# Patient Record
Sex: Male | Born: 1980 | Race: White | Hispanic: No | Marital: Single | Smoking: Never smoker
Health system: Southern US, Community
[De-identification: ages and names within clinical notes are randomized; demographics above are authoritative.]

## PROBLEM LIST (undated history)

## (undated) DIAGNOSIS — K859 Acute pancreatitis without necrosis or infection, unspecified: Secondary | ICD-10-CM

## (undated) HISTORY — PX: CHOLECYSTECTOMY: SHX55

---

## 2010-12-13 DIAGNOSIS — A6000 Herpesviral infection of urogenital system, unspecified: Secondary | ICD-10-CM | POA: Insufficient documentation

## 2020-05-23 DIAGNOSIS — Z8719 Personal history of other diseases of the digestive system: Secondary | ICD-10-CM | POA: Insufficient documentation

## 2020-12-15 ENCOUNTER — Encounter (HOSPITAL_BASED_OUTPATIENT_CLINIC_OR_DEPARTMENT_OTHER): Payer: Self-pay | Admitting: *Deleted

## 2020-12-15 ENCOUNTER — Emergency Department (HOSPITAL_BASED_OUTPATIENT_CLINIC_OR_DEPARTMENT_OTHER): Payer: 59

## 2020-12-15 ENCOUNTER — Inpatient Hospital Stay (HOSPITAL_BASED_OUTPATIENT_CLINIC_OR_DEPARTMENT_OTHER)
Admission: EM | Admit: 2020-12-15 | Discharge: 2020-12-18 | DRG: 439 | Discharge status: Against Medical Advice | Payer: 59 | Attending: Family Medicine | Admitting: Family Medicine

## 2020-12-15 ENCOUNTER — Inpatient Hospital Stay (HOSPITAL_COMMUNITY): Payer: 59

## 2020-12-15 ENCOUNTER — Other Ambulatory Visit: Payer: Self-pay

## 2020-12-15 DIAGNOSIS — I10 Essential (primary) hypertension: Secondary | ICD-10-CM | POA: Diagnosis present

## 2020-12-15 DIAGNOSIS — R748 Abnormal levels of other serum enzymes: Secondary | ICD-10-CM | POA: Diagnosis present

## 2020-12-15 DIAGNOSIS — Z9049 Acquired absence of other specified parts of digestive tract: Secondary | ICD-10-CM

## 2020-12-15 DIAGNOSIS — K859 Acute pancreatitis without necrosis or infection, unspecified: Secondary | ICD-10-CM

## 2020-12-15 DIAGNOSIS — D649 Anemia, unspecified: Secondary | ICD-10-CM | POA: Diagnosis not present

## 2020-12-15 DIAGNOSIS — K8521 Alcohol induced acute pancreatitis with uninfected necrosis: Principal | ICD-10-CM | POA: Diagnosis present

## 2020-12-15 DIAGNOSIS — R17 Unspecified jaundice: Secondary | ICD-10-CM | POA: Diagnosis present

## 2020-12-15 DIAGNOSIS — R7989 Other specified abnormal findings of blood chemistry: Secondary | ICD-10-CM | POA: Diagnosis present

## 2020-12-15 DIAGNOSIS — R000 Tachycardia, unspecified: Secondary | ICD-10-CM | POA: Diagnosis present

## 2020-12-15 DIAGNOSIS — K8689 Other specified diseases of pancreas: Secondary | ICD-10-CM | POA: Diagnosis not present

## 2020-12-15 DIAGNOSIS — I82890 Acute embolism and thrombosis of other specified veins: Secondary | ICD-10-CM | POA: Diagnosis not present

## 2020-12-15 DIAGNOSIS — Z5329 Procedure and treatment not carried out because of patient's decision for other reasons: Secondary | ICD-10-CM | POA: Diagnosis present

## 2020-12-15 DIAGNOSIS — R7401 Elevation of levels of liver transaminase levels: Secondary | ICD-10-CM | POA: Diagnosis present

## 2020-12-15 DIAGNOSIS — K8522 Alcohol induced acute pancreatitis with infected necrosis: Secondary | ICD-10-CM | POA: Diagnosis not present

## 2020-12-15 DIAGNOSIS — F1020 Alcohol dependence, uncomplicated: Secondary | ICD-10-CM | POA: Diagnosis present

## 2020-12-15 DIAGNOSIS — K852 Alcohol induced acute pancreatitis without necrosis or infection: Secondary | ICD-10-CM | POA: Diagnosis not present

## 2020-12-15 DIAGNOSIS — I864 Gastric varices: Secondary | ICD-10-CM | POA: Diagnosis present

## 2020-12-15 DIAGNOSIS — Z20822 Contact with and (suspected) exposure to covid-19: Secondary | ICD-10-CM | POA: Diagnosis present

## 2020-12-15 DIAGNOSIS — R509 Fever, unspecified: Secondary | ICD-10-CM | POA: Diagnosis not present

## 2020-12-15 DIAGNOSIS — I82891 Chronic embolism and thrombosis of other specified veins: Secondary | ICD-10-CM | POA: Diagnosis present

## 2020-12-15 DIAGNOSIS — E876 Hypokalemia: Secondary | ICD-10-CM | POA: Diagnosis not present

## 2020-12-15 HISTORY — DX: Acute pancreatitis without necrosis or infection, unspecified: K85.90

## 2020-12-15 LAB — CBC
HCT: 42.5 % (ref 39.0–52.0)
HCT: 43.5 % (ref 39.0–52.0)
Hemoglobin: 15.8 g/dL (ref 13.0–17.0)
Hemoglobin: 15.3 g/dL (ref 13.0–17.0)
MCH: 33.7 pg (ref 26.0–34.0)
MCH: 33.3 pg (ref 26.0–34.0)
MCHC: 37.2 g/dL — ABNORMAL HIGH (ref 30.0–36.0)
MCHC: 35.2 g/dL (ref 30.0–36.0)
MCV: 90.6 fL (ref 80.0–100.0)
MCV: 94.8 fL (ref 80.0–100.0)
Platelets: 304 10*3/uL (ref 150–400)
Platelets: 248 10*3/uL (ref 150–400)
RBC: 4.69 MIL/uL (ref 4.22–5.81)
RBC: 4.59 MIL/uL (ref 4.22–5.81)
RDW: 13.4 % (ref 11.5–15.5)
RDW: 13.9 % (ref 11.5–15.5)
WBC: 17.1 10*3/uL — ABNORMAL HIGH (ref 4.0–10.5)
WBC: 14.4 10*3/uL — ABNORMAL HIGH (ref 4.0–10.5)
nRBC: 0 % (ref 0.0–0.2)
nRBC: 0 % (ref 0.0–0.2)

## 2020-12-15 LAB — MAGNESIUM
Magnesium: 1.7 mg/dL (ref 1.7–2.4)
Magnesium: 1.6 mg/dL — ABNORMAL LOW (ref 1.7–2.4)

## 2020-12-15 LAB — COMPREHENSIVE METABOLIC PANEL
ALT: 24 U/L (ref 0–44)
ALT: 167 U/L — ABNORMAL HIGH (ref 0–44)
AST: 25 U/L (ref 15–41)
AST: 441 U/L — ABNORMAL HIGH (ref 15–41)
Albumin: 4.1 g/dL (ref 3.5–5.0)
Albumin: 3.9 g/dL (ref 3.5–5.0)
Alkaline Phosphatase: 68 U/L (ref 38–126)
Alkaline Phosphatase: 87 U/L (ref 38–126)
Anion gap: 13 (ref 5–15)
Anion gap: 9 (ref 5–15)
BUN: 13 mg/dL (ref 6–20)
BUN: 10 mg/dL (ref 6–20)
CO2: 21 mmol/L — ABNORMAL LOW (ref 22–32)
CO2: 21 mmol/L — ABNORMAL LOW (ref 22–32)
Calcium: 8.6 mg/dL — ABNORMAL LOW (ref 8.9–10.3)
Calcium: 8.4 mg/dL — ABNORMAL LOW (ref 8.9–10.3)
Chloride: 104 mmol/L (ref 98–111)
Chloride: 111 mmol/L (ref 98–111)
Creatinine, Ser: 0.94 mg/dL (ref 0.61–1.24)
Creatinine, Ser: 0.94 mg/dL (ref 0.61–1.24)
GFR, Estimated: >60 mL/min (ref >60)
GFR, Estimated: >60 mL/min (ref >60)
Glucose, Bld: 132 mg/dL — ABNORMAL HIGH (ref 70–99)
Glucose, Bld: 123 mg/dL — ABNORMAL HIGH (ref 70–99)
Potassium: 3.4 mmol/L — ABNORMAL LOW (ref 3.5–5.1)
Potassium: 3.5 mmol/L (ref 3.5–5.1)
Sodium: 138 mmol/L (ref 135–145)
Sodium: 141 mmol/L (ref 135–145)
Total Bilirubin: 0.9 mg/dL (ref 0.3–1.2)
Total Bilirubin: 2.6 mg/dL — ABNORMAL HIGH (ref 0.3–1.2)
Total Protein: 7.1 g/dL (ref 6.5–8.1)
Total Protein: 6.5 g/dL (ref 6.5–8.1)

## 2020-12-15 LAB — PHOSPHORUS
Phosphorus: 3.9 mg/dL (ref 2.5–4.6)
Phosphorus: 3.7 mg/dL (ref 2.5–4.6)

## 2020-12-15 LAB — PROTIME-INR
INR: 1 (ref 0.8–1.2)
Prothrombin Time: 13.3 seconds (ref 11.4–15.2)

## 2020-12-15 LAB — APTT: aPTT: 25 seconds (ref 24–36)

## 2020-12-15 LAB — GAMMA GT: GGT: 342 U/L — ABNORMAL HIGH (ref 7–50)

## 2020-12-15 LAB — LIPASE, BLOOD: Lipase: 686 U/L — ABNORMAL HIGH (ref 11–51)

## 2020-12-15 LAB — RESP PANEL BY RT-PCR (FLU A&B, COVID) ARPGX2
Influenza A by PCR: NEGATIVE
Influenza B by PCR: NEGATIVE
SARS Coronavirus 2 by RT PCR: NEGATIVE

## 2020-12-15 LAB — HIV ANTIBODY (ROUTINE TESTING W REFLEX): HIV Screen 4th Generation wRfx: NONREACTIVE

## 2020-12-15 MED ORDER — ONDANSETRON HCL 4 MG/2ML IJ SOLN
4.0000 mg | Freq: Once | INTRAMUSCULAR | Status: AC
  Administered 2020-12-15: 4 mg via INTRAVENOUS
  Filled 2020-12-15: qty 2

## 2020-12-15 MED ORDER — ONDANSETRON HCL 4 MG PO TABS: 4.0000 mg | ORAL_TABLET | Freq: Four times a day (QID) | ORAL | Status: DC | PRN

## 2020-12-15 MED ORDER — LORAZEPAM 1 MG PO TABS
0.0000 mg | ORAL_TABLET | Freq: Four times a day (QID) | ORAL | Status: AC
  Administered 2020-12-15 – 2020-12-16 (×5): 1 mg via ORAL
  Filled 2020-12-15 (×3): qty 1

## 2020-12-15 MED ORDER — IOHEXOL 300 MG/ML  SOLN
100.0000 mL | Freq: Once | INTRAMUSCULAR | Status: AC | PRN
  Administered 2020-12-15: 100 mL via INTRAVENOUS

## 2020-12-15 MED ORDER — LORAZEPAM 1 MG PO TABS: 0.0000 mg | ORAL_TABLET | Freq: Two times a day (BID) | ORAL | Status: DC

## 2020-12-15 MED ORDER — SODIUM CHLORIDE 0.9 % IV SOLN: INTRAVENOUS | Status: DC

## 2020-12-15 MED ORDER — LORAZEPAM 2 MG/ML IJ SOLN: 1.0000 mg | INTRAMUSCULAR | Status: DC | PRN

## 2020-12-15 MED ORDER — ACETAMINOPHEN 650 MG RE SUPP: 650.0000 mg | Freq: Four times a day (QID) | RECTAL | Status: DC | PRN

## 2020-12-15 MED ORDER — ADULT MULTIVITAMIN W/MINERALS CH
1.0000 | ORAL_TABLET | Freq: Every day | ORAL | Status: DC
  Administered 2020-12-15: 1 via ORAL
  Filled 2020-12-15: qty 1

## 2020-12-15 MED ORDER — LACTATED RINGERS IV SOLN: INTRAVENOUS | Status: DC

## 2020-12-15 MED ORDER — ONDANSETRON HCL 4 MG/2ML IJ SOLN
INTRAMUSCULAR | Status: AC
  Filled 2020-12-15: qty 2

## 2020-12-15 MED ORDER — GADOBUTROL 1 MMOL/ML IV SOLN
8.0000 mL | Freq: Once | INTRAVENOUS | Status: AC | PRN
  Administered 2020-12-15: 8 mL via INTRAVENOUS

## 2020-12-15 MED ORDER — MAGNESIUM SULFATE 2 GM/50ML IV SOLN
2.0000 g | Freq: Once | INTRAVENOUS | Status: AC
  Administered 2020-12-15: 2 g via INTRAVENOUS
  Filled 2020-12-15: qty 50

## 2020-12-15 MED ORDER — THIAMINE HCL 100 MG/ML IJ SOLN: 100.0000 mg | Freq: Every day | INTRAMUSCULAR | Status: DC

## 2020-12-15 MED ORDER — LORAZEPAM 1 MG PO TABS
1.0000 mg | ORAL_TABLET | ORAL | Status: DC | PRN
  Filled 2020-12-15 (×2): qty 1

## 2020-12-15 MED ORDER — SODIUM CHLORIDE 0.9% FLUSH
3.0000 mL | Freq: Two times a day (BID) | INTRAVENOUS | Status: DC
  Administered 2020-12-15 – 2020-12-17 (×4): 3 mL via INTRAVENOUS

## 2020-12-15 MED ORDER — FENTANYL CITRATE (PF) 100 MCG/2ML IJ SOLN
50.0000 ug | Freq: Once | INTRAMUSCULAR | Status: AC
  Administered 2020-12-15: 50 ug via INTRAVENOUS
  Filled 2020-12-15: qty 2

## 2020-12-15 MED ORDER — ONDANSETRON HCL 4 MG/2ML IJ SOLN
4.0000 mg | Freq: Four times a day (QID) | INTRAMUSCULAR | Status: DC | PRN
  Administered 2020-12-16: 4 mg via INTRAVENOUS
  Filled 2020-12-15: qty 2

## 2020-12-15 MED ORDER — POLYETHYLENE GLYCOL 3350 17 G PO PACK: 17.0000 g | PACK | Freq: Every day | ORAL | Status: DC | PRN

## 2020-12-15 MED ORDER — SODIUM CHLORIDE 0.9 % IV BOLUS
500.0000 mL | Freq: Once | INTRAVENOUS | Status: AC
  Administered 2020-12-15: 500 mL via INTRAVENOUS

## 2020-12-15 MED ORDER — HALOPERIDOL LACTATE 5 MG/ML IJ SOLN
2.0000 mg | Freq: Once | INTRAMUSCULAR | Status: AC
  Administered 2020-12-15: 2 mg via INTRAVENOUS
  Filled 2020-12-15: qty 1

## 2020-12-15 MED ORDER — ACETAMINOPHEN 325 MG PO TABS
650.0000 mg | ORAL_TABLET | Freq: Four times a day (QID) | ORAL | Status: DC | PRN
  Administered 2020-12-16 – 2020-12-18 (×4): 650 mg via ORAL
  Filled 2020-12-15 (×4): qty 2

## 2020-12-15 MED ORDER — HYDROMORPHONE HCL 1 MG/ML IJ SOLN
0.5000 mg | INTRAMUSCULAR | Status: DC | PRN
  Administered 2020-12-15 – 2020-12-17 (×15): 1 mg via INTRAVENOUS
  Filled 2020-12-15 (×15): qty 1

## 2020-12-15 MED ORDER — THIAMINE HCL 100 MG PO TABS
100.0000 mg | ORAL_TABLET | Freq: Every day | ORAL | Status: DC
  Administered 2020-12-15 – 2020-12-17 (×3): 100 mg via ORAL
  Filled 2020-12-15 (×3): qty 1

## 2020-12-15 MED ORDER — FOLIC ACID 1 MG PO TABS
1.0000 mg | ORAL_TABLET | Freq: Every day | ORAL | Status: DC
  Administered 2020-12-15 – 2020-12-17 (×3): 1 mg via ORAL
  Filled 2020-12-15 (×3): qty 1

## 2020-12-15 MED ORDER — MORPHINE SULFATE (PF) 4 MG/ML IV SOLN
4.0000 mg | Freq: Once | INTRAVENOUS | Status: AC
  Administered 2020-12-15: 4 mg via INTRAVENOUS
  Filled 2020-12-15: qty 1

## 2020-12-15 MED ORDER — ENOXAPARIN SODIUM 40 MG/0.4ML IJ SOSY
40.0000 mg | PREFILLED_SYRINGE | INTRAMUSCULAR | Status: DC
  Administered 2020-12-15 – 2020-12-17 (×3): 40 mg via SUBCUTANEOUS
  Filled 2020-12-15 (×3): qty 0.4

## 2020-12-15 MED ORDER — MORPHINE SULFATE (PF) 2 MG/ML IV SOLN
2.0000 mg | Freq: Once | INTRAVENOUS | Status: AC
  Administered 2020-12-15: 2 mg via INTRAVENOUS
  Filled 2020-12-15: qty 1

## 2020-12-15 MED ORDER — MORPHINE SULFATE (PF) 4 MG/ML IV SOLN
8.0000 mg | Freq: Once | INTRAVENOUS | Status: AC
  Administered 2020-12-15: 8 mg via INTRAVENOUS
  Filled 2020-12-15: qty 2

## 2020-12-15 MED ORDER — HYDRALAZINE HCL 20 MG/ML IJ SOLN
5.0000 mg | INTRAMUSCULAR | Status: AC | PRN
Start: 2020-12-15 — End: 2020-12-16
  Administered 2020-12-15 – 2020-12-16 (×3): 5 mg via INTRAVENOUS
  Filled 2020-12-15 (×3): qty 1

## 2020-12-15 MED ORDER — OXYCODONE HCL 5 MG PO TABS
5.0000 mg | ORAL_TABLET | ORAL | Status: DC | PRN
  Administered 2020-12-15: 5 mg via ORAL
  Filled 2020-12-15: qty 1

## 2020-12-15 MED ORDER — METOPROLOL TARTRATE 5 MG/5ML IV SOLN
5.0000 mg | Freq: Four times a day (QID) | INTRAVENOUS | Status: DC | PRN
  Administered 2020-12-15 – 2020-12-17 (×8): 5 mg via INTRAVENOUS
  Filled 2020-12-15 (×9): qty 5

## 2020-12-15 NOTE — Plan of Care (Signed)
  Problem: Education: Goal: Knowledge of General Education information will improve Description Including pain rating scale, medication(s)/side effects and non-pharmacologic comfort measures Outcome: Progressing   

## 2020-12-15 NOTE — ED Provider Notes (Signed)
[facility] EMERGENCY DEPARTMENT Provider Note   CSN: [id number] Arrival date & time: 12/15/20  0004     History Chief Complaint  Patient presents with  . Pancreatitis    Brian Moran is a 40 y.o. male.  The history is provided by the patient.  Abdominal Pain Pain location:  RUQ and epigastric Pain quality: cramping   Pain radiates to:  R flank Pain severity:  Severe Onset quality:  Gradual Duration:  1 day Timing:  Constant Progression:  Worsening Chronicity:  Recurrent Context: alcohol use and eating   Relieved by:  Nothing Worsened by:  Nothing Ineffective treatments:  None tried Associated symptoms: no cough, no diarrhea and no fever   Risk factors: not elderly   Patient with a h/o pancreatitis visiting from [place] presents with typical pancreatitis pain.  No f/c/r.  Has been eating barbecue and drinking heavily.      Past Medical History:  Diagnosis Date  . Pancreatitis     There are no problems to display for this patient.   Past Surgical History:  Procedure Laterality Date  . CHOLECYSTECTOMY         History reviewed. No pertinent family history.  Social History   Tobacco Use  . Smoking status: Never  . Smokeless tobacco: Never  Substance Use Topics  . Alcohol use: Yes  . Drug use: Never    Home Medications Prior to Admission medications   Not on File    Allergies    Patient has no known allergies.  Review of Systems   Review of Systems  Constitutional:  Negative for fever.  HENT:  Negative for facial swelling.   Eyes:  Negative for redness.  Respiratory:  Negative for cough.   Gastrointestinal:  Positive for abdominal pain. Negative for diarrhea.  Genitourinary:  Negative for difficulty urinating.  Musculoskeletal:  Negative for neck stiffness.  Skin:  Negative for rash.  Neurological:  Negative for facial asymmetry.  Psychiatric/Behavioral:  Negative for agitation.   All other systems reviewed and are  negative.  Physical Exam Updated Vital Signs BP (!) 169/103   Pulse 72   Temp 98.9 F (37.2 C) (Oral)   Resp 20   Wt 79.4 kg   SpO2 97%   Physical Exam Vitals and nursing note reviewed.  Constitutional:      Appearance: Normal appearance. He is not diaphoretic.  HENT:     Head: Normocephalic and atraumatic.     Nose: Nose normal.  Eyes:     Conjunctiva/sclera: Conjunctivae normal.     Pupils: Pupils are equal, round, and reactive to light.  Cardiovascular:     Rate and Rhythm: Normal rate and regular rhythm.     Pulses: Normal pulses.     Heart sounds: Normal heart sounds.  Pulmonary:     Effort: Pulmonary effort is normal.     Breath sounds: Normal breath sounds.  Abdominal:     General: Bowel sounds are normal. There is no distension.     Palpations: Abdomen is soft.     Tenderness: There is no guarding or rebound.  Musculoskeletal:        General: Normal range of motion.     Cervical back: Normal range of motion and neck supple.  Skin:    General: Skin is warm and dry.     Capillary Refill: Capillary refill takes less than 2 seconds.  Neurological:     General: No focal deficit present.     Mental Status:  He is alert and oriented to person, place, and time.  Psychiatric:        Mood and Affect: Mood normal.        Behavior: Behavior normal.    ED Results / Procedures / Treatments   Labs (all labs ordered are listed, but only abnormal results are displayed) Results for orders placed or performed during the hospital encounter of 12/15/20  Lipase, blood  Result Value Ref Range   Lipase 686 (H) 11 - 51 U/L  Comprehensive metabolic panel  Result Value Ref Range   Sodium 138 135 - 145 mmol/L   Potassium 3.4 (L) 3.5 - 5.1 mmol/L   Chloride 104 98 - 111 mmol/L   CO2 21 (L) 22 - 32 mmol/L   Glucose, Bld 132 (H) 70 - 99 mg/dL   BUN 13 6 - 20 mg/dL   Creatinine, Ser 2.42 0.61 - 1.24 mg/dL   Calcium 8.6 (L) 8.9 - 10.3 mg/dL   Total Protein 7.1 6.5 - 8.1 g/dL    Albumin 4.1 3.5 - 5.0 g/dL   AST 25 15 - 41 U/L   ALT 24 0 - 44 U/L   Alkaline Phosphatase 68 38 - 126 U/L   Total Bilirubin 0.9 0.3 - 1.2 mg/dL   GFR, Estimated >68 >34 mL/min   Anion gap 13 5 - 15  CBC  Result Value Ref Range   WBC 17.1 (H) 4.0 - 10.5 K/uL   RBC 4.69 4.22 - 5.81 MIL/uL   Hemoglobin 15.8 13.0 - 17.0 g/dL   HCT 19.6 22.2 - 97.9 %   MCV 90.6 80.0 - 100.0 fL   MCH 33.7 26.0 - 34.0 pg   MCHC 37.2 (H) 30.0 - 36.0 g/dL   RDW 89.2 11.9 - 41.7 %   Platelets 304 150 - 400 K/uL   nRBC 0.0 0.0 - 0.2 %   No results found.  EKG EKG Interpretation  Date/Time:  Saturday December 15 2020 00:54:55 EDT Ventricular Rate:  71 PR Interval:  134 QRS Duration: 95 QT Interval:  398 QTC Calculation: 433 R Axis:   74 Text Interpretation: Sinus rhythm Confirmed by Nicanor Alcon, Tila Millirons ([id number]) on 12/15/2020 1:26:56 AM  Radiology No results found.  Procedures Procedures   Medications Ordered in ED Medications  ondansetron (ZOFRAN) injection 4 mg (has no administration in time range)  fentaNYL (SUBLIMAZE) injection 50 mcg (has no administration in time range)  sodium chloride 0.9 % bolus 500 mL (500 mLs Intravenous New Bag/Given 12/15/20 0223)    ED Course  I have reviewed the triage vital signs and the nursing notes.  Pertinent labs & imaging results that were available during my care of the patient were reviewed by me and considered in my medical decision making (see chart for details).    Brian Moran was evaluated in Emergency Department on 12/15/2020 for the symptoms described in the history of present illness. He was evaluated in the context of the global COVID-19 pandemic, which necessitated consideration that the patient might be at risk for infection with the SARS-CoV-2 virus that causes COVID-19. Institutional protocols and algorithms that pertain to the evaluation of patients at risk for COVID-19 are in a state of rapid change based on information released by regulatory  bodies including the CDC and federal and state organizations. These policies and algorithms were followed during the patient's care in the ED.  Final Clinical Impression(s) / ED Diagnoses Final diagnoses:  Acute pancreatitis, unspecified complication status, unspecified pancreatitis type  Admit  to medicine for acute pancreatitis   Rx / DC Orders ED Discharge Orders     None        Sukaina Toothaker, MD 12/15/20 6378

## 2020-12-15 NOTE — ED Triage Notes (Signed)
Pt with hx of pancreatitis (last hospitalization was last year) is here for flare up.  Pt reports abdominal pain since this am.  Pt reports that he is on vacation so he was not adhering to his usual diet and reports that he was drinking heavily.  Pain 7/10, pt appears uncomfortable.

## 2020-12-15 NOTE — Progress Notes (Signed)
   12/15/20 2101 12/15/20 2116 12/15/20 2204  Assess: MEWS Score  Temp 99 F (37.2 C)  --   --   BP (!) 179/116  --  (!) 174/109  Pulse Rate (!) 110  --   --   Resp 18  --   --   SpO2 90 %  --   --   O2 Device Room Air  --   --   Assess: MEWS Score  MEWS Temp 0  --  0  MEWS Systolic 0  --  0  MEWS Pulse 1  --  1  MEWS RR 0  --  0  MEWS LOC 0  --  0  MEWS Score 1  --  1  MEWS Score Color [place]  --  [place]  Treat  Pain Score  --  3  --   Notify: Provider  Provider Name/Title  --  X. Blount NP  --   Date Provider Notified  --  12/15/20  --   Time Provider Notified  --  2130  --   Notification Type  --   (amion)  --   Notification Reason  --  Other (Comment) (BP elevated 179/116)  --   Provider response  --  See new orders  --   Date of Provider Response  --  12/15/20  --   Time of Provider Response  --  2139  --   Assess: SIRS CRITERIA  SIRS Temperature  0  --   --   SIRS Pulse 1  --   --   SIRS Respirations  0  --   --   SIRS WBC 0  --   --   SIRS Score Sum  1  --   --   Hydralazine ordered and given at 2205. Will follow up and recheck

## 2020-12-15 NOTE — H&P (Signed)
[facility] History and Physical  Brian Moran PIR:[id number] DOB: 08-06-1980 DOA: 12/15/2020  Referring physician: Dr. Nicanor Alcon PCP: No primary care provider on file.   Chief Complaint: abdominal pain  HPI: Brian Moran is a 40 y.o. male with history of alcohol use disorder, acute pancreatitis, presents with abdominal pain.  Patient is currently visiting his mother who moved here recently for her job.  He resides in [facility] [place].  Review of care everywhere shows patient was admitted in January 2022 for acute pancreatitis, per discharge summary did not appear to have any significant symptoms of alcohol withdrawal but was discharged with Librium.  Further review shows that patient had gallstone pancreatitis in November 2020, at that time he had an ERCP with biliary sphincterotomy and placement of a biliary stent.  It was subsequently removed in February 2021.  On my interview patient states that he was eating a lot of barbecue and drinking heavily yesterday when he began to have abdominal pain.  Abdominal pain continued to worsen so he presented for care.  Pain is located across the epigastrium and upper abdominal quadrants, and also radiates to his back.  Endorses nausea but no other symptoms, denies fever, cough, chest pain, shortness of breath, diarrhea.  He denies daily alcohol use but states that he drinks around 1-2 times a week.  He estimates he drinks about 3 beers on the days that he does drink.  He endorses feeling that his drinking is a problem and reports he has been prescribed naltrexone in the past.  He is interested in therapy to help with his drinking.  He also reports a history of gallbladder removal in the past that happened at the same time as prior pancreatitis episodes, though he also notes he was drinking during that episode as well.  Patient denies ever having had symptoms of alcohol withdrawal in the past.  Initially presented to med [facility]  ED.  In the ED initial vital signs notable only for significant hypertension with BPs ranging 140s to 180s over 80s to 110s.  Initial lab work-up notable for lipase of 686, CMP showing no other significant abnormalities, CBC with white count of 17 but otherwise normal.  EKG was unremarkable.  COVID test was negative.  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  A repeat CMP was obtained which showed a significant jump in LFTs with AST 441, ALT 167, and a T bili elevation to 2.6 which had not been seen previously.  He was treated with fluids, pain medications, and admitted for further management.  Review of Systems:  Pertinent positives and negative per HPI, all others reviewed and negative  Past Medical History:  Diagnosis Date   Pancreatitis    Past Surgical History:  Procedure Laterality Date   CHOLECYSTECTOMY     Social History:  reports that he has never smoked. He has never used smokeless tobacco. He reports current alcohol use. He reports that he does not use drugs.  No Known Allergies  History reviewed. No pertinent family history.   Prior to Admission medications   Medication Sig Start Date End Date Taking? Authorizing Provider  ibuprofen (ADVIL) 200 MG tablet Take 400 mg by mouth every 6 (six) hours as needed.   Yes [provider]   Physical Exam: Vitals:   12/15/20 0800 12/15/20 0808 12/15/20 0849 12/15/20 0852  BP: (!) 155/87   Marland Kitchen)  189/106  Pulse: 72   60  Resp:    (!) 22  Temp:  99 F (37.2 C)  98.5 F (36.9 C)  TempSrc:  Oral  Oral  SpO2: 98%   97%  Weight:   87.7 kg   Height:   5\' 7"  (1.702 m)     Wt Readings from Last 3 Encounters:  12/15/20 87.7 kg     General:  Appears calm but uncomfortable Eyes: no scleral icterus ENT: grossly normal hearing, lips & tongue Neck: no masses Cardiovascular: RRR, no m/r/g. No LE edema. Respiratory: CTA  bilaterally, no w/r/r. Normal respiratory effort. Abdomen: soft, diffuse ttp in the upper quadrants and epigastrium especially Skin: no rash or induration seen on limited exam Musculoskeletal: grossly normal tone BUE/BLE Psychiatric: grossly normal mood and affect, speech fluent and appropriate Neurologic: grossly non-focal.          Labs on Admission:  Basic Metabolic Panel: Recent Labs  Lab 12/15/20 0056 12/15/20 1117  NA 138 141  K 3.4* 3.5  CL 104 111  CO2 21* 21*  GLUCOSE 132* 123*  BUN 13 10  CREATININE 0.94 0.94  CALCIUM 8.6* 8.4*  MG 1.7 1.6*  PHOS 3.9 3.7   Liver Function Tests: Recent Labs  Lab 12/15/20 0056 12/15/20 1117  AST 25 441*  ALT 24 167*  ALKPHOS 68 87  BILITOT 0.9 2.6*  PROT 7.1 6.5  ALBUMIN 4.1 3.9   Recent Labs  Lab 12/15/20 0056  LIPASE 686*   No results for input(s): AMMONIA in the last 168 hours. CBC: Recent Labs  Lab 12/15/20 0056 12/15/20 1117  WBC 17.1* 14.4*  HGB 15.8 15.3  HCT 42.5 43.5  MCV 90.6 94.8  PLT 304 248   Cardiac Enzymes: No results for input(s): CKTOTAL, CKMB, CKMBINDEX, TROPONINI in the last 168 hours.  BNP (last 3 results) No results for input(s): BNP in the last 8760 hours.  ProBNP (last 3 results) No results for input(s): PROBNP in the last 8760 hours.  CBG: No results for input(s): GLUCAP in the last 168 hours.  Radiological Exams on Admission: CT ABDOMEN PELVIS W CONTRAST  Result Date: 12/15/2020 CLINICAL DATA:  Abdominal pain, acute, nonlocalized. History of pancreatitis. EXAM: CT ABDOMEN AND PELVIS WITH CONTRAST TECHNIQUE: Multidetector CT imaging of the abdomen and pelvis was performed using the standard protocol following bolus administration of intravenous contrast. CONTRAST:  12/17/2020 OMNIPAQUE IOHEXOL 300 MG/ML  SOLN COMPARISON:  None. FINDINGS: Lower chest: Mild bibasilar atelectasis. The visualized heart and pericardium are unremarkable. Hepatobiliary: No focal liver abnormality is seen.  Status post cholecystectomy. No biliary dilatation. Pancreas: There is mild peripancreatic inflammatory change extending into the porta hepatis and into the anterior pararenal spaces bilaterally as well as the mesentery in keeping with changes of acute edematous/interstitial pancreatitis. Normal enhancement of the pancreatic parenchyma. Pancreatic duct is not dilated. Punctate nonspecific calcifications are seen within the pancreatic head, possibly the sequela of remote pancreatitis. No in capsulated peripancreatic fluid collections or necrosis. No peripancreatic adenopathy. Spleen: Unremarkable Adrenals/Urinary Tract: Adrenal glands are unremarkable. Kidneys are normal, without renal calculi, focal lesion, or hydronephrosis. Bladder is unremarkable. Stomach/Bowel: Periduodenal inflammatory change likely relates to adjacent pancreatitis. Stomach is within normal limits. Appendix appears normal. No other evidence of bowel wall thickening, distention, or inflammatory changes. No free intraperitoneal gas or fluid. Vascular/Lymphatic: There is chronic thrombosis of the splenic vein with numerous varices identified within the gastric fundus. Superior mesenteric vein and portal vein are patent. The abdominal vasculature  is otherwise unremarkable. Reproductive: Prostate is unremarkable. Other: No abdominal wall hernia identified.  Rectum unremarkable. Musculoskeletal: No acute or significant osseous findings. IMPRESSION: Mild acute interstitial/edematous pancreatitis. No evidence of pancreatic or peripancreatic necrosis. 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 Electronically Signed   By: Helyn Numbers M.D.   On: 12/15/2020 02:55    EKG: Independently reviewed.  Sinus rhythm no acute ischemic changes.  No priors for comparison.  Assessment/Plan Active Problems:   Acute pancreatitis   Alcohol use disorder, severe, dependence  (HCC)  Brian Moran is a 40 y.o. male with history of alcohol use disorder, acute pancreatitis, presents with abdominal pain.  #Pancreatitis #Alcohol use disorder Lipase 686 on arrival in setting of heavy alcohol use.  Patient with multiple prior episodes.  Interested in cessation of alcohol use but options are limited in setting of elevated LFTs.  No reported episodes of alcohol withdrawal from patient I cannot find any in the chart, but will place him on CIWA regardless out of caution. - N.p.o. - LR at 150 cc an hour - CIWA protocol with Ativan - If LFTs improve patient would be interested in resuming p.o. naltrexone therapy for alcohol use disorder - IV pain meds as needed  #Abnormal LFT's #Hyperbilirubinemia #?Alcoholic hepatitis? Review of chart shows patient has history of choledocholithiasis with gallstone pancreatitis, he is status postcholecystectomy as well as placement and removal of the biliary stent.  The sudden jump in his LFTs and bili somewhat difficult to interpret given his history.  Patient may have Denovo stone formation in his CBD, will obtain an MRCP to further evaluate this.  I am also concerned for alcoholic hepatitis, although per his history this seems somewhat less likely.  We will plan to consult GI for further recommendations. - Trend LFTs - Follow-up MRCP - Follow-up GI recommendations - Obtain acute hepatitis panel for completeness - Obtain coags - Check GGT  #Chronic splenic vein thrombosis This appears to be a new finding compared to MRI pancreas obtained in [place] in January of this year.  Unfortunately also has associated varices.  Unclear if he would be a good candidate for anticoagulation, will hold at this time pending GI evaluation.  #Hypertension Variable pressures over the last several hours, may be driven largely by pain.  Now that is getting under better control, will monitor.  Plan to start treatment if pressures do not trend down, as  review of outside pressures in Care Everywhere show he is normally normotensive to only mildly hypertensive.  Code Status: Full Code DVT Prophylaxis: lovenox Family Communication: none Disposition Plan: Inpatient, Med-surg   Time spent: 70 min  Venora Maples MD/MP[facility]  Note:  This document was prepared using Conservation officer, historic buildings and may include unintentional dictation errors.

## 2020-12-16 DIAGNOSIS — K8689 Other specified diseases of pancreas: Secondary | ICD-10-CM

## 2020-12-16 LAB — HEPATITIS PANEL, ACUTE
HCV Ab: NONREACTIVE
Hep A IgM: NONREACTIVE
Hep B C IgM: NONREACTIVE
Hepatitis B Surface Ag: NONREACTIVE

## 2020-12-16 LAB — CBC
HCT: 46.2 % (ref 39.0–52.0)
Hemoglobin: 16.2 g/dL (ref 13.0–17.0)
MCH: 33.1 pg (ref 26.0–34.0)
MCHC: 35.1 g/dL (ref 30.0–36.0)
MCV: 94.3 fL (ref 80.0–100.0)
Platelets: 251 10*3/uL (ref 150–400)
RBC: 4.9 MIL/uL (ref 4.22–5.81)
RDW: 13.9 % (ref 11.5–15.5)
WBC: 25.9 10*3/uL — ABNORMAL HIGH (ref 4.0–10.5)
nRBC: 0 % (ref 0.0–0.2)

## 2020-12-16 LAB — COMPREHENSIVE METABOLIC PANEL
ALT: 110 U/L — ABNORMAL HIGH (ref 0–44)
AST: 106 U/L — ABNORMAL HIGH (ref 15–41)
Albumin: 3.3 g/dL — ABNORMAL LOW (ref 3.5–5.0)
Alkaline Phosphatase: 100 U/L (ref 38–126)
Anion gap: 8 (ref 5–15)
BUN: 9 mg/dL (ref 6–20)
CO2: 24 mmol/L (ref 22–32)
Calcium: 8.2 mg/dL — ABNORMAL LOW (ref 8.9–10.3)
Chloride: 107 mmol/L (ref 98–111)
Creatinine, Ser: 0.88 mg/dL (ref 0.61–1.24)
GFR, Estimated: >60 mL/min (ref >60)
Glucose, Bld: 119 mg/dL — ABNORMAL HIGH (ref 70–99)
Potassium: 3.3 mmol/L — ABNORMAL LOW (ref 3.5–5.1)
Sodium: 139 mmol/L (ref 135–145)
Total Bilirubin: 1.7 mg/dL — ABNORMAL HIGH (ref 0.3–1.2)
Total Protein: 6 g/dL — ABNORMAL LOW (ref 6.5–8.1)

## 2020-12-16 LAB — MRSA NEXT GEN BY PCR, NASAL: MRSA by PCR Next Gen: NOT DETECTED

## 2020-12-16 LAB — PROTIME-INR
INR: 1.3 — ABNORMAL HIGH (ref 0.8–1.2)
Prothrombin Time: 15.9 seconds — ABNORMAL HIGH (ref 11.4–15.2)

## 2020-12-16 LAB — LACTIC ACID, PLASMA
Lactic Acid, Venous: 1.3 mmol/L (ref 0.5–1.9)
Lactic Acid, Venous: 0.9 mmol/L (ref 0.5–1.9)

## 2020-12-16 LAB — APTT: aPTT: 27 seconds (ref 24–36)

## 2020-12-16 MED ORDER — HYDRALAZINE HCL 20 MG/ML IJ SOLN
5.0000 mg | Freq: Once | INTRAMUSCULAR | Status: AC
  Administered 2020-12-16: 5 mg via INTRAVENOUS
  Filled 2020-12-16: qty 1

## 2020-12-16 MED ORDER — SODIUM CHLORIDE 0.9 % IV BOLUS (SEPSIS): 1000.0000 mL | Freq: Once | INTRAVENOUS | Status: DC

## 2020-12-16 MED ORDER — SODIUM CHLORIDE 0.9 % IV SOLN: INTRAVENOUS | Status: DC | PRN

## 2020-12-16 MED ORDER — CHLORHEXIDINE GLUCONATE CLOTH 2 % EX PADS
6.0000 | MEDICATED_PAD | Freq: Every day | CUTANEOUS | Status: DC
  Administered 2020-12-16 – 2020-12-17 (×2): 6 via TOPICAL

## 2020-12-16 MED ORDER — ORAL CARE MOUTH RINSE
15.0000 mL | Freq: Two times a day (BID) | OROMUCOSAL | Status: DC
  Administered 2020-12-16 – 2020-12-17 (×4): 15 mL via OROMUCOSAL

## 2020-12-16 MED ORDER — SODIUM CHLORIDE 0.9 % IV SOLN: INTRAVENOUS | Status: DC

## 2020-12-16 MED ORDER — SODIUM CHLORIDE 0.9 % IV BOLUS (SEPSIS)
1000.0000 mL | Freq: Once | INTRAVENOUS | Status: AC
  Administered 2020-12-16: 1000 mL via INTRAVENOUS

## 2020-12-16 MED ORDER — SODIUM CHLORIDE 0.9 % IV SOLN
1.0000 g | Freq: Three times a day (TID) | INTRAVENOUS | Status: DC
  Administered 2020-12-16 – 2020-12-18 (×6): 1 g via INTRAVENOUS
  Filled 2020-12-16 (×7): qty 1

## 2020-12-16 NOTE — Progress Notes (Signed)
LA 1.3 and Pt hypertensive. Okay to stop bolus per Irene Limbo, MD.

## 2020-12-16 NOTE — Consult Note (Signed)
Referring Provider:  [facility] Primary Care Physician:  No primary care provider on file. Primary Gastroenterologist: Gentry Fitz  Reason for Consultation: Necrotizing pancreatitis  HPI: Brian Moran is a 40 y.o. male with past medical history of alcohol abuse, history of alcohol induced and gallstone pancreatitis in 02/2019 S/P cholecystectomy and ERCP. ERCP at that time showed biliary stricture and stent was placed which was subsequently removed during repeat ERCP in February 2021.  ERCP at that time showed no evidence of biliary stricture.  He was also admitted to the hospital in January 2022 for alcohol induced pancreatitis.  He presented to the hospital yesterday with abdominal pain.  Blood work showed elevated lipase at 686, normal LFTs, and leukocytosis. 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  Patient subsequently developed elevated LFTs with T bili of 2.6 AST 441 and ALT of 167.  MRI MRCP yesterday showed moderate to severe acute pancreatitis with focal area of necrosis in the pancreatic tail.  No evidence of biliary dilation or choledocholithiasis.  Splenic vein thrombosis.  He is now having worsening leukocytosis.  He is tachycardic now.  T-max 100.9 this morning.  Patient seen and examined at bedside.  His epigastric abdominal pain is much better compared to yesterday.  Had 1 episode of loose stool yesterday.  No bowel movement today.  Denies nausea and vomiting.  Overall he is feeling much better today compared to yesterday.  He admits drinking heavy alcohol recently because of being on vacation.    Past Medical History:  Diagnosis Date   Pancreatitis     Past Surgical History:  Procedure Laterality Date   CHOLECYSTECTOMY      Prior to Admission medications   Medication Sig Start Date End Date Taking? Authorizing Provider  ibuprofen (ADVIL) 200 MG tablet Take 400 mg by mouth every 6 (six) hours as needed.    Yes [provider]    Scheduled Meds:  enoxaparin (LOVENOX) injection  40 mg Subcutaneous Q24H   folic acid  1 mg Oral Daily   LORazepam  0-4 mg Oral Q6H   Followed by   Melene Muller ON 12/17/2020] LORazepam  0-4 mg Oral Q12H   multivitamin with minerals  1 tablet Oral Daily   sodium chloride flush  3 mL Intravenous Q12H   thiamine  100 mg Oral Daily   Or   thiamine  100 mg Intravenous Daily   Continuous Infusions:  lactated ringers Stopped (12/16/20 0750)   PRN Meds:.acetaminophen **OR** acetaminophen, hydrALAZINE, HYDROmorphone (DILAUDID) injection, LORazepam **OR** [DISCONTINUED] LORazepam, metoprolol tartrate, ondansetron **OR** ondansetron (ZOFRAN) IV, oxyCODONE, polyethylene glycol  Allergies as of 12/15/2020   (No Known Allergies)    History reviewed. No pertinent family history.  Social History   Socioeconomic History   Marital status: Single    Spouse name: Not on file   Number of children: Not on file   Years of education: Not on file   Highest education level: Not on file  Occupational History   Not on file  Tobacco Use   Smoking status: Never   Smokeless tobacco: Never  Substance and Sexual Activity   Alcohol use: Yes   Drug use: Never   Sexual activity: Not on file  Other Topics Concern   Not on file  Social History Narrative   Not on file   Social Determinants of Health   Financial Resource Strain: Not on file  Food Insecurity: Not on file  Transportation Needs: Not on file  Physical Activity: Not on file  Stress: Not on file  Social Connections: Not on file  Intimate Partner Violence: Not on file    Review of Systems: 12 point review of system is done which is negative except as mentioned in HPI  Physical Exam: Vital signs: Vitals:   12/16/20 0547 12/16/20 0656  BP: (!) 182/118 (!) 152/104  Pulse: (!) 118 (!) 121  Resp: 18 20  Temp: (!) 100.9 F (38.3 C) 99.1 F (37.3 C)  SpO2: 95% 91%   Last BM Date: 12/14/20 Physical  Exam Constitutional:      General: He is not in acute distress.    Appearance: Normal appearance. He is not ill-appearing.  HENT:     Head: Normocephalic and atraumatic.     Nose: Nose normal.     Mouth/Throat:     Mouth: Mucous membranes are moist.     Pharynx: Oropharynx is clear. No oropharyngeal exudate.  Eyes:     General: No scleral icterus.    Extraocular Movements: Extraocular movements intact.  Cardiovascular:     Rate and Rhythm: Regular rhythm. Tachycardia present.     Heart sounds: No murmur heard. Pulmonary:     Effort: Pulmonary effort is normal.     Breath sounds: Rales present.  Abdominal:     General: Bowel sounds are normal. There is no distension.     Palpations: Abdomen is soft.     Tenderness: There is no abdominal tenderness.     Comments: No significant tenderness on physical exam today.  Mild epigastric discomfort noted.  No rebound or guarding.  Musculoskeletal:        General: No swelling or tenderness. Normal range of motion.     Cervical back: Normal range of motion.     Right lower leg: No edema.     Left lower leg: No edema.  Skin:    General: Skin is warm.     Coloration: Skin is not jaundiced.  Neurological:     Mental Status: He is alert and oriented to person, place, and time.  Psychiatric:        Mood and Affect: Mood normal.        Behavior: Behavior normal.        Thought Content: Thought content normal.        Judgment: Judgment normal.     GI:  Lab Results: Recent Labs    12/15/20 0056 12/15/20 1117 12/16/20 0320  WBC 17.1* 14.4* 25.9*  HGB 15.8 15.3 16.2  HCT 42.5 43.5 46.2  PLT 304 248 251   BMET Recent Labs    12/15/20 0056 12/15/20 1117 12/16/20 0320  NA 138 141 139  K 3.4* 3.5 3.3*  CL 104 111 107  CO2 21* 21* 24  GLUCOSE 132* 123* 119*  BUN 13 10 9   CREATININE 0.94 0.94 0.88  CALCIUM 8.6* 8.4* 8.2*   LFT Recent Labs    12/16/20 0320  PROT 6.0*  ALBUMIN 3.3*  AST 106*  ALT 110*  ALKPHOS 100   BILITOT 1.7*   PT/INR Recent Labs    12/15/20 1309  LABPROT 13.3  INR 1.0     Studies/Results: CT ABDOMEN PELVIS W CONTRAST  Result Date: 12/15/2020 CLINICAL DATA:  Abdominal pain, acute, nonlocalized. History of pancreatitis. EXAM: CT ABDOMEN AND PELVIS WITH CONTRAST TECHNIQUE: Multidetector CT imaging of the abdomen and pelvis was performed using the standard protocol following bolus administration of intravenous contrast. CONTRAST:  12/17/2020 OMNIPAQUE IOHEXOL 300 MG/ML  SOLN COMPARISON:  None. FINDINGS: Lower chest: Mild bibasilar atelectasis. The visualized heart and pericardium are unremarkable. Hepatobiliary: No focal liver abnormality is seen. Status post cholecystectomy. No biliary dilatation. Pancreas: There is mild peripancreatic inflammatory change extending into the porta hepatis and into the anterior pararenal spaces bilaterally as well as the mesentery in keeping with changes of acute edematous/interstitial pancreatitis. Normal enhancement of the pancreatic parenchyma. Pancreatic duct is not dilated. Punctate nonspecific calcifications are seen within the pancreatic head, possibly the sequela of remote pancreatitis. No in capsulated peripancreatic fluid collections or necrosis. No peripancreatic adenopathy. Spleen: Unremarkable Adrenals/Urinary Tract: Adrenal glands are unremarkable. Kidneys are normal, without renal calculi, focal lesion, or hydronephrosis. Bladder is unremarkable. Stomach/Bowel: Periduodenal inflammatory change likely relates to adjacent pancreatitis. Stomach is within normal limits. Appendix appears normal. No other evidence of bowel wall thickening, distention, or inflammatory changes. No free intraperitoneal gas or fluid. Vascular/Lymphatic: There is chronic thrombosis of the splenic vein with numerous varices identified within the gastric fundus. Superior mesenteric vein and portal vein are patent. The abdominal vasculature is otherwise unremarkable. Reproductive:  Prostate is unremarkable. Other: No abdominal wall hernia identified.  Rectum unremarkable. Musculoskeletal: No acute or significant osseous findings. IMPRESSION: Mild acute interstitial/edematous pancreatitis. No evidence of pancreatic or peripancreatic necrosis. 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 Electronically Signed   By: Helyn Numbers M.D.   On: 12/15/2020 02:55   MR 3D Recon At Scanner  Result Date: 12/15/2020 CLINICAL DATA:  Acute pancreatitis. Severe abdominal pain. Elevated liver function tests. Alcohol dependence. Prior cholecystectomy. EXAM: MRI ABDOMEN WITHOUT AND WITH CONTRAST (INCLUDING MRCP) TECHNIQUE: Multiplanar multisequence MR imaging of the abdomen was performed both before and after the administration of intravenous contrast. Heavily T2-weighted images of the biliary and pancreatic ducts were obtained, and three-dimensional MRCP images were rendered by post processing. CONTRAST:  53mL GADAVIST GADOBUTROL 1 MMOL/ML IV SOLN COMPARISON:  CT on 12/15/2020 FINDINGS: Lower chest: No acute findings. Hepatobiliary: No hepatic masses identified. Mild ascites noted. Prior cholecystectomy. No evidence of biliary ductal dilatation or choledocholithiasis. Pancreas: Diffuse pancreatic swelling, with mild-to-moderate peripancreatic inflammatory changes and fluid, consistent with acute pancreatitis. A focal area of absent contrast enhancement is seen in the pancreatic tail measuring approximately 3.7 x 2.0 cm, consistent with area of pancreatic necrosis. No evidence of pseudocysts. No evidence of pancreatic mass or pancreatic ductal dilatation. Spleen:  Within normal limits in size and appearance. Adrenals/Urinary Tract: No masses identified. No evidence of hydronephrosis. Stomach/Bowel: Visualized portion unremarkable. Vascular/Lymphatic: No pathologically enlarged lymph nodes identified. Splenic vein thrombosis is again  seen with venous collaterals in left upper quadrant. Other:  None. Musculoskeletal:  No suspicious bone lesions identified. IMPRESSION: Moderate to severe acute pancreatitis, with focal area of necrosis in the pancreatic tail. No evidence of pancreatic pseudocyst. Prior cholecystectomy. No evidence of biliary ductal dilatation or choledocholithiasis. Splenic vein thrombosis with left upper quadrant venous collaterals. Electronically Signed   By: Danae Orleans M.D.   On: 12/15/2020 18:18   MR ABDOMEN MRCP W WO CONTAST  Result Date: 12/15/2020 CLINICAL DATA:  Acute pancreatitis. Severe abdominal pain. Elevated liver function tests. Alcohol dependence. Prior cholecystectomy. EXAM: MRI ABDOMEN WITHOUT AND WITH CONTRAST (INCLUDING MRCP) TECHNIQUE: Multiplanar multisequence MR imaging of the abdomen was performed both before and after the administration of intravenous contrast. Heavily T2-weighted images of the biliary and pancreatic ducts were obtained, and three-dimensional MRCP images were rendered by post processing. CONTRAST:  66mL GADAVIST  GADOBUTROL 1 MMOL/ML IV SOLN COMPARISON:  CT on 12/15/2020 FINDINGS: Lower chest: No acute findings. Hepatobiliary: No hepatic masses identified. Mild ascites noted. Prior cholecystectomy. No evidence of biliary ductal dilatation or choledocholithiasis. Pancreas: Diffuse pancreatic swelling, with mild-to-moderate peripancreatic inflammatory changes and fluid, consistent with acute pancreatitis. A focal area of absent contrast enhancement is seen in the pancreatic tail measuring approximately 3.7 x 2.0 cm, consistent with area of pancreatic necrosis. No evidence of pseudocysts. No evidence of pancreatic mass or pancreatic ductal dilatation. Spleen:  Within normal limits in size and appearance. Adrenals/Urinary Tract: No masses identified. No evidence of hydronephrosis. Stomach/Bowel: Visualized portion unremarkable. Vascular/Lymphatic: No pathologically enlarged lymph nodes  identified. Splenic vein thrombosis is again seen with venous collaterals in left upper quadrant. Other:  None. Musculoskeletal:  No suspicious bone lesions identified. IMPRESSION: Moderate to severe acute pancreatitis, with focal area of necrosis in the pancreatic tail. No evidence of pancreatic pseudocyst. Prior cholecystectomy. No evidence of biliary ductal dilatation or choledocholithiasis. Splenic vein thrombosis with left upper quadrant venous collaterals. Electronically Signed   By: Danae OrleansJohn A Stahl M.D.   On: 12/15/2020 18:18    Impression/Plan: -Acute alcohol induced necrotizing pancreatitis.  Patient now with low-grade fever and worsening leukocytosis but clinically improving. -Abnormal LFTs.  Probably from above  Recommendations -------------------------- -Recommend aggressive hydration for next 24 hours with 250 cc fluids per hour -Start imipenem or meropenem -Patient wanted to get discharged tomorrow but I think he may need to be in hospital for next few days to get IV hydration and IV antibiotics.Risk of  severe complications from necrotizing pancreatitis discussed with the patient. -Discussed with RN at bedside.  Discussed with hospitalist. -If he feels hungry, okay to have clear liquid diet later today - alcohol abstinence discussed with the patient -GI will follow    LOS: 1 day   Kathi DerParag Marston Mccadden  MD, FACP 12/16/2020, 9:20 AM  Contact #  [phone]

## 2020-12-16 NOTE — Progress Notes (Signed)
Received verbal order from Dr. Brendia Sacks, MD to transfer patient to stepdown. IV maintenance fluid has been changed to Normal saline at 250 ml/hr per Dr. Brendia Sacks, MD. IV antibiotic and first bolus has been started per code sepsis protocol. Bed placement has been notified and there is a room opening for patient to transfer to stepdown.

## 2020-12-16 NOTE — Significant Event (Signed)
Rapid Response Event Note   Reason for Call :  Red MEWS  Interventions:  Advised RN to treat temperature and continue with upgrading patient to progessive bed.  Plan of Care:  Pt to move from M/S to progressive for closer monitoring    Jessica Priest, RN

## 2020-12-16 NOTE — Significant Event (Signed)
Rapid Response Event Note   Reason for Call :  Called back for concerns of SIRS  Initial Focused Assessment:  Pt resting in bed, oriented x4. Flushed face. Pt complains of mild pain in abdomen but states it's improved since yesterday. SIRS criteria met Source of infection identified Blood cultures already drawn this morning, abx had not been initiated prior to rapid.  No recent LA on file.     Interventions:  LA drawn, Ice applied to patient. Meropenem has been hung. NS bolus ordered by MD and initiated while waiting for LA result. BP elevated. MD did not want to treat at this time. Agree that progressive floor more appropriate for patient. Bedside RN called MD back at the end of rapid and requested SD bed. Orders received. Plan of Care:  Pt transferred to SDU room 5852 with no complications.   Event Summary:   MD Notified: Sarajane Jews, MD Call Time: [place] Time: 1100 End Time: [place], RN

## 2020-12-16 NOTE — Progress Notes (Signed)
Brian Moran, Rapid Response RN is assessing patient now to make sure that patient does not need to go to ICU stepdown. Waiting on further instructions from Maralyn Sago, RN. If patient needs to go to stepdown, will notify Dr. Irene Limbo, MD.

## 2020-12-16 NOTE — Progress Notes (Signed)
Cathe Mons, Rapid Response RN and I transferred patient via wheelchair to room 1226 with IV fluids running and vital signs being monitored. Patient had all of his belongings with him. Bedside report given to [place], Charity fundraiser.

## 2020-12-16 NOTE — Plan of Care (Signed)
Pt alert and oriented x 4. Med compliant. LR continues at 180ml/hr. PRN meds dilaudidx2, metoprolol x 1, Hydralazine x2. Pt bp remains elevated. Linton Flemings NP aware. Currently being monitored vitals signs every 2 hours with MEWS protocol due to elevated heart rate. Will continue to monitor.  Problem: Education: Goal: Knowledge of General Education information will improve Description: Including pain rating scale, medication(s)/side effects and non-pharmacologic comfort measures Outcome: Progressing   Problem: Health Behavior/Discharge Planning: Goal: Ability to manage health-related needs will improve Outcome: Progressing   Problem: Clinical Measurements: Goal: Ability to maintain clinical measurements within normal limits will improve Outcome: Progressing Goal: Will remain free from infection Outcome: Progressing Goal: Diagnostic test results will improve Outcome: Progressing Goal: Respiratory complications will improve Outcome: Progressing Goal: Cardiovascular complication will be avoided Outcome: Progressing   Problem: Activity: Goal: Risk for activity intolerance will decrease Outcome: Progressing   Problem: Nutrition: Goal: Adequate nutrition will be maintained Outcome: Progressing   Problem: Coping: Goal: Level of anxiety will decrease Outcome: Progressing   Problem: Elimination: Goal: Will not experience complications related to bowel motility Outcome: Progressing Goal: Will not experience complications related to urinary retention Outcome: Progressing   Problem: Pain Managment: Goal: General experience of comfort will improve Outcome: Progressing   Problem: Safety: Goal: Ability to remain free from injury will improve Outcome: Progressing   Problem: Skin Integrity: Goal: Risk for impaired skin integrity will decrease Outcome: Progressing   Problem: Education: Goal: Knowledge of Pancreatitis treatment and prevention will improve Outcome: Progressing    Problem: Health Behavior/Discharge Planning: Goal: Ability to formulate a plan to maintain an alcohol-free life will improve Outcome: Progressing   Problem: Clinical Measurements: Goal: Complications related to the disease process, condition or treatment will be avoided or minimized Outcome: Progressing

## 2020-12-16 NOTE — Progress Notes (Signed)
At 9:30 AM, Dr. Irene Limbo, MD had concerns about patient regarding the following things: high WBC, low grade fever, pancreatic necrosis, and decompensation. Assessed the patient and got the following vital signs below. Received order for patient to be transferred to progressive care. A bed opening came up for room 1406 here at [facility]. In the midst of calling Dellie Burns, RN on 4th floor to give report, received a phone call regarding SIRS criteria being 4 and was asked if rapid response has seen the patient. Rapid response was notified of the patient's vitals, but had not come to see the patient at the time. After the phone call, contacted Cathe Mons, Rapid Response RN again to explain the SIRS being 4 and asked her to come assess the patient. Made Zerita Boers, Charge RN and Dr. Brendia Sacks, MD aware of the patient's vital signs. After discussing the RED MEWS with Dr. Irene Limbo, MD, received order for patient to be transferred to ICU stepdown on 2nd floor at [facility].     12/16/20 1004  Vitals  Temp (!) 102.4 F (39.1 C)  Temp Source Oral  BP (!) 161/107  MAP (mmHg) (!) 12  BP Location Left Arm  BP Method Automatic  Patient Position (if appropriate) Lying  Pulse Rate (!) 120  Pulse Rate Source Dinamap  Resp (!) 28  Level of Consciousness  Level of Consciousness Alert  MEWS COLOR  MEWS Score Color Red  Oxygen Therapy  SpO2 95 %  O2 Device Room Air  Pain Assessment  Pain Scale 0-10  Pain Score 2  Pain Type Acute pain  Pain Location Abdomen  Pain Orientation Anterior  Pain Radiating Towards back  Pain Descriptors / Indicators Cramping  Pain Frequency Constant  Pain Onset Sudden  Patients Stated Pain Goal 1  Pain Intervention(s) Distraction;Rest;Emotional support  Multiple Pain Sites No  POSS Scale ([place] Opioid Sedation Scale)  POSS *See Group Information* 1-Acceptable,Awake and alert  Complaints & Interventions  Complains of Other (Comment) (No complaints from patient at this  time)  MEWS Score  MEWS Temp 2  MEWS Systolic 0  MEWS Pulse 2  MEWS RR 2  MEWS LOC 0  MEWS Score 6  Provider Notification  Provider Name/Title Dr. Brendia Sacks, MD  Date Provider Notified 12/16/20  Time Provider Notified 1050  Notification Type Page  Notification Reason Change in status  Provider response See new orders (Received the following orders from Dr. Irene Limbo: Transfer patient to 2nd floor at [facility] (stepdown unit) and per code sepsis protocol, give three Normal saline boluses.)  Rapid Response Notification  Name of Rapid Response RN Notified Cathe Mons, RN (Notified Sarah, RN to inform that SIRS criteria is 4 and asked her to come see the patient.)  Date Rapid Response Notified 12/16/20  Time Rapid Response Notified 1034

## 2020-12-16 NOTE — Progress Notes (Signed)
Elink monitoring for sepsis protocol 

## 2020-12-16 NOTE — Hospital Course (Signed)
40 year old man PMH multiple episodes of pancreatitis including gallstone, previous stent, presented to the emergency department with abdominal pain.  Admitted for acute pancreatitis.  MRCP revealed necrosis, patient with low-grade temperature and leukocytosis.  Starting antibiotics.

## 2020-12-16 NOTE — Progress Notes (Signed)
Pharmacy Antibiotic Note  Brian Moran is a 40 y.o. male admitted on 12/15/2020 with  necrotizing pancreatitis .  Pharmacy has been consulted for meropenem dosing.  Plan: Meropenem 1g IV q8  Height: 5\' 7"  (170.2 cm) Weight: 87.7 kg (193 lb 5.5 oz) IBW/kg (Calculated) : 66.1  Temp (24hrs), Avg:99.4 F (37.4 C), Min:98.5 F (36.9 C), Max:100.9 F (38.3 C)  Recent Labs  Lab 12/15/20 0056 12/15/20 1117 12/16/20 0320  WBC 17.1* 14.4* 25.9*  CREATININE 0.94 0.94 0.88    Estimated Creatinine Clearance: 115.5 mL/min (by C-G formula based on SCr of 0.88 mg/dL).    No Known Allergies    Thank you for allowing pharmacy to be a part of this patient's care.  12/18/20 12/16/2020 10:04 AM

## 2020-12-16 NOTE — Plan of Care (Signed)
  Problem: Education: Goal: Knowledge of General Education information will improve Description Including pain rating scale, medication(s)/side effects and non-pharmacologic comfort measures Outcome: Progressing   

## 2020-12-16 NOTE — Assessment & Plan Note (Deleted)
--   Symptomatically much improved, however has low-grade temperature, marked leukocytosis and evidence of necrosis on MRCP.  Clinically appears well.  However given constellation of findings, concern for decompensation.  Transfer to monitored bed. -- Aggressive IV fluids, start empiric antibiotics, remain NPO.  GI consultation.

## 2020-12-16 NOTE — Progress Notes (Signed)
Called report to Dellie Burns, RN on 4th floor.

## 2020-12-16 NOTE — Progress Notes (Signed)
Bed open for patient to transfer to room 1226. Just attempted to call report to second floor and nobody is available. Told them patient is in a code sepsis. Gave stepdown unit my call back number.

## 2020-12-16 NOTE — Progress Notes (Signed)
   12/16/20 0153  Assess: MEWS Score  Temp 99.2 F (37.3 C)  BP (!) 156/106  Pulse Rate (!) 119  Resp 20  SpO2 91 %  Assess: MEWS Score  MEWS Temp 0  MEWS Systolic 0  MEWS Pulse 2  MEWS RR 0  MEWS LOC 0  MEWS Score 2  MEWS Score Color Yellow  Assess: if the MEWS score is Yellow or Red  Were vital signs taken at a resting state? Yes  Focused Assessment No change from prior assessment  Does the patient meet 2 or more of the SIRS criteria? No  MEWS guidelines implemented *See Row Information* Yes  Take Vital Signs  Increase Vital Sign Frequency  Yellow: Q 2hr X 2 then Q 4hr X 2, if remains yellow, continue Q 4hrs  Escalate  MEWS: Escalate Yellow: discuss with charge nurse/RN and consider discussing with provider and RRT  Notify: Charge Nurse/RN  Name of Charge Nurse/RN Notified Museum/gallery curator  Date Charge Nurse/RN Notified 12/16/20  Time Charge Nurse/RN Notified 0200  Notify: Provider  Provider Name/Title Linton Flemings NP  Date Provider Notified 12/16/20  Time Provider Notified 0205  Notification Type  (amion)  Notification Reason Other (Comment) (HR 119 not meet parameter for ativan with CIWA)  Provider response No new orders  Date of Provider Response 12/16/20  Time of Provider Response 0256 (seen)  Assess: SIRS CRITERIA  SIRS Temperature  0  SIRS Pulse 1  SIRS Respirations  0  SIRS WBC 0  SIRS Score Sum  1

## 2020-12-16 NOTE — Plan of Care (Signed)
Discussed with patient plan of care for the evening, pain management, ice chips and mouth care with some teach back displayed.  Problem: Education: Goal: Knowledge of General Education information will improve Description: Including pain rating scale, medication(s)/side effects and non-pharmacologic comfort measures Outcome: Progressing   Problem: Health Behavior/Discharge Planning: Goal: Ability to manage health-related needs will improve Outcome: Progressing

## 2020-12-16 NOTE — Progress Notes (Signed)
PROGRESS NOTE  Brian Moran UXN:[id number] DOB: Jul 21, 1980 DOA: 12/15/2020 PCP: No primary care provider on file.  Brief History   40 year old man PMH multiple episodes of pancreatitis including gallstone, previous stent, presented to the emergency department with abdominal pain.  Admitted for acute pancreatitis.  MRCP revealed necrosis, patient with low-grade temperature and leukocytosis.  Starting antibiotics.  A & P  Acute alcoholic pancreatitis with necrosis --Clinically appears stable and feels well, however WBC markedly elevated, imaging reveals necrosis of the pancreatic tail.   -- Aggressive IV fluids, empiric antibiotics, transfer to monitored bed, GI consultation, at high risk for decompensation.   --check BC  Alcohol use disorder  --monitor for withdrawal  Elevated LFTs, AST and ALT trending down, total bilirubin trending down, likely related to alcohol abuse. --Supportive care.   Chronic splenic vein thrombosis -- Supportive care.  Further recommendations per gastroenterology.  Disposition Plan:  Discussion:   Status is: Inpatient  Remains inpatient appropriate because:IV treatments appropriate due to intensity of illness or inability to take PO and Inpatient level of care appropriate due to severity of illness  Dispo: The patient is from: Home              Anticipated d/c is to: Home              Patient currently is not medically stable to d/c.   Difficult to place patient No  DVT prophylaxis: enoxaparin (LOVENOX) injection 40 mg Start: 12/15/20 1800   Code Status: Full Code Level of care: Med-Surg Family Communication: none  Brendia Sacks, MD  [facility] Direct contact: see [email or web address] (further directions at bottom of note if needed) 7PM-7AM contact night coverage as at bottom of note 12/16/2020, 9:27 AM  LOS: 1 day   Significant Hospital Events   8/20 admit for pancreatitis 8/21 MRCP shows necrosis, low grade temp, WBC high; GI consult    Consults:  GI   Procedures:  None  Significant Diagnostic Tests:  MRCP moderate to severe acute pancreatitis w/ focal necrosis of tail   Micro Data:  None    Antimicrobials:  Imipenem   Interval History/Subjective  CC: f/u abd pain  Feels "much better" No pain No n/v Breathing fine  Objective   Vitals:  Vitals:   12/16/20 0547 12/16/20 0656  BP: (!) 182/118 (!) 152/104  Pulse: (!) 118 (!) 121  Resp: 18 20  Temp: (!) 100.9 F (38.3 C) 99.1 F (37.3 C)  SpO2: 95% 91%    Exam: Physical Exam Vitals reviewed.  Constitutional:      Appearance: Normal appearance.  Cardiovascular:     Rate and Rhythm: Regular rhythm. Tachycardia present.     Heart sounds: No murmur heard. Pulmonary:     Effort: Pulmonary effort is normal. No respiratory distress.     Breath sounds: Normal breath sounds. No wheezing, rhonchi or rales.  Abdominal:     General: There is no distension.     Tenderness: There is no abdominal tenderness. There is no guarding.  Neurological:     Mental Status: He is alert.  Psychiatric:        Mood and Affect: Mood normal.        Behavior: Behavior normal.    I have personally reviewed the labs and other data, making special note of:   Today's Data  K+ 3.3 AST down ALT down T bili down to 1.7 WBC up to 25.9  Scheduled Meds:  enoxaparin (LOVENOX) injection  40 mg Subcutaneous Q24H  folic acid  1 mg Oral Daily   LORazepam  0-4 mg Oral Q6H   Followed by   Melene Muller ON 12/17/2020] LORazepam  0-4 mg Oral Q12H   sodium chloride flush  3 mL Intravenous Q12H   thiamine  100 mg Oral Daily   Or   thiamine  100 mg Intravenous Daily   Continuous Infusions:  lactated ringers Stopped (12/16/20 0750)    Principal Problem:   Acute pancreatitis Active Problems:   Alcohol use disorder, severe, dependence (HCC)   Transaminitis   History of laparoscopic cholecystectomy   Hyperbilirubinemia   Splenic vein thrombosis   Pancreatic necrosis   LOS: 1  day   How to contact the [facility] Attending or Consulting provider 7A - 7P or covering provider during after hours 7P -7A, for this patient?  Check the care team in [facility] and look for a) attending/consulting TRH provider listed and b) the [facility] team listed Log into [email or web address] and use [place]'s universal password to access. If you do not have the password, please contact the hospital operator. Locate the [facility] provider you are looking for under [facility] and page to a number that you can be directly reached. If you still have difficulty reaching the provider, please page the  [facility] (Director on Call) for the Hospitalists listed on amion for assistance.

## 2020-12-17 LAB — MAGNESIUM: Magnesium: 2 mg/dL (ref 1.7–2.4)

## 2020-12-17 LAB — COMPREHENSIVE METABOLIC PANEL
ALT: 53 U/L — ABNORMAL HIGH (ref 0–44)
AST: 34 U/L (ref 15–41)
Albumin: 2.8 g/dL — ABNORMAL LOW (ref 3.5–5.0)
Alkaline Phosphatase: 90 U/L (ref 38–126)
Anion gap: 7 (ref 5–15)
BUN: 9 mg/dL (ref 6–20)
CO2: 24 mmol/L (ref 22–32)
Calcium: 7.8 mg/dL — ABNORMAL LOW (ref 8.9–10.3)
Chloride: 106 mmol/L (ref 98–111)
Creatinine, Ser: 0.79 mg/dL (ref 0.61–1.24)
GFR, Estimated: >60 mL/min (ref >60)
Glucose, Bld: 88 mg/dL (ref 70–99)
Potassium: 3.9 mmol/L (ref 3.5–5.1)
Sodium: 137 mmol/L (ref 135–145)
Total Bilirubin: 1.6 mg/dL — ABNORMAL HIGH (ref 0.3–1.2)
Total Protein: 5.7 g/dL — ABNORMAL LOW (ref 6.5–8.1)

## 2020-12-17 LAB — CBC
HCT: 41.9 % (ref 39.0–52.0)
Hemoglobin: 14.5 g/dL (ref 13.0–17.0)
MCH: 33.8 pg (ref 26.0–34.0)
MCHC: 34.6 g/dL (ref 30.0–36.0)
MCV: 97.7 fL (ref 80.0–100.0)
Platelets: 187 10*3/uL (ref 150–400)
RBC: 4.29 MIL/uL (ref 4.22–5.81)
RDW: 14.3 % (ref 11.5–15.5)
WBC: 22 10*3/uL — ABNORMAL HIGH (ref 4.0–10.5)
nRBC: 0 % (ref 0.0–0.2)

## 2020-12-17 LAB — PHOSPHORUS: Phosphorus: 2.1 mg/dL — ABNORMAL LOW (ref 2.5–4.6)

## 2020-12-17 LAB — LIPASE, BLOOD: Lipase: 56 U/L — ABNORMAL HIGH (ref 11–51)

## 2020-12-17 MED ORDER — HYDRALAZINE HCL 20 MG/ML IJ SOLN
10.0000 mg | INTRAMUSCULAR | Status: AC | PRN
Start: 2020-12-17 — End: 2020-12-17
  Administered 2020-12-17 (×3): 10 mg via INTRAVENOUS
  Filled 2020-12-17 (×3): qty 1

## 2020-12-17 MED ORDER — POTASSIUM PHOSPHATES 15 MMOLE/5ML IV SOLN
20.0000 mmol | Freq: Once | INTRAVENOUS | Status: AC
  Administered 2020-12-17: 20 mmol via INTRAVENOUS
  Filled 2020-12-17: qty 6.67

## 2020-12-17 MED ORDER — LABETALOL HCL 5 MG/ML IV SOLN
5.0000 mg | INTRAVENOUS | Status: DC | PRN
  Administered 2020-12-17 – 2020-12-18 (×2): 5 mg via INTRAVENOUS
  Filled 2020-12-17: qty 4

## 2020-12-17 NOTE — TOC Initial Note (Signed)
Transition of Care [facility]) - Initial/Assessment Note    Patient Details  Name: Brian Moran MRN: [id number] Date of Birth: 31-Dec-1980  Transition of Care [facility]) CM/SW Contact:    Golda Acre, RN Phone Number: 12/17/2020, 8:42 AM  Clinical Narrative:                 40 year old man PMH multiple episodes of pancreatitis including gallstone, previous stent, presented to the emergency department with abdominal pain.  Admitted for acute pancreatitis.  MRCP revealed necrosis, patient with low-grade temperature and leukocytosis.  Starting antibiotics.   A & P  Acute alcoholic pancreatitis with necrosis --Clinically appears stable and feels well, however WBC markedly elevated, imaging reveals necrosis of the pancreatic tail.   -- Aggressive IV fluids, empiric antibiotics, transfer to monitored bed, GI consultation, at high risk for decompensation.   --check BC   Alcohol use disorder  --monitor for withdrawal   Elevated LFTs, AST and ALT trending down, total bilirubin trending down, likely related to alcohol abuse. --Supportive care.   Chronic splenic vein thrombosis -- Supportive care.  Further recommendations per gastroenterology. TOC PLAN OF CARE: Following for toc needs from home is single, will follow for hhc needs, will substance abuse resources for cessation of etoh intake. Following for progression  Expected Discharge Plan: Home/Self Care Barriers to Discharge: Continued Medical Work up   Patient Goals and CMS Choice        Expected Discharge Plan and Services Expected Discharge Plan: Home/Self Care   Discharge Planning Services: CM Consult   Living arrangements for the past 2 months: Single Family Home                                      Prior Living Arrangements/Services Living arrangements for the past 2 months: Single Family Home Lives with:: Self Patient language and need for interpreter reviewed:: Yes Do you feel safe going back to the  place where you live?: Yes            Criminal Activity/Legal Involvement Pertinent to Current Situation/Hospitalization: No - Comment as needed  Activities of Daily Living Home Assistive Devices/Equipment: None ADL Screening (condition at time of admission) Patient's cognitive ability adequate to safely complete daily activities?: Yes Is the patient deaf or have difficulty hearing?: No Does the patient have difficulty seeing, even when wearing glasses/contacts?: No Does the patient have difficulty concentrating, remembering, or making decisions?: No Patient able to express need for assistance with ADLs?: Yes Does the patient have difficulty dressing or bathing?: No Independently performs ADLs?: Yes (appropriate for developmental age) Does the patient have difficulty walking or climbing stairs?: No Weakness of Legs: None Weakness of Arms/Hands: None  Permission Sought/Granted                  Emotional Assessment Appearance:: Appears stated age     Orientation: : Oriented to Self, Oriented to Place, Oriented to  Time, Oriented to Situation Alcohol / Substance Use: Alcohol Use Psych Involvement: No (comment)  Admission diagnosis:  Acute pancreatitis [K85.90] 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 Pharmacy:   [id number] - [place], [place] - [address] AT [facility] [address]  [address] [place] [address] Phone: [phone] Fax: [phone]     Social Determinants of Health (SDOH) Interventions    Readmission Risk Interventions No flowsheet data found.

## 2020-12-17 NOTE — Progress Notes (Signed)
UNASSIGNED PATIENT Subjective: Brian Moran is a 40 year old white male with a history of alcohol abuse and multiple episodes of pancreatitis this including his episode of biliary pancreatitis resulting in laparoscopic cholecystectomy, who presented to the hospital with severe right upper quadrant epigastric pain radiating to the right right flank on 12/15/2020. A CT scan of the abdomen pelvis done on admission revealed mild acute interstitial/edematous pancreatitis with no evidence of pancreatic necrosis; chronic thrombosis of the splenic vein with numerous portal portal gastric varices were identified in the gastric fundus.  A follow-up MRCP revealed diffuse pancreatic swelling with mild to moderate peripancreatic inflammatory changes consistent with acute pancreatitis and a focal area of absent contrast-enhancement seen in the pancreatic tail measuring 3.7 x 2 cm consistent with pancreatic necrosis. There was no evidence of pseudocyst and no pancreatic mass or ductal dilatation was appreciated. Splenic vein thrombosis with left upper quadrant venous collaterals were described.  Patient claims he is eating well has had a good bowel movement today with no blood or mucus in the stool.  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  Objective: Vital signs in last 24 hours: Temp:  [98.9 F (37.2 C)-102.3 F (39.1 C)] 98.9 F (37.2 C) (08/22 1130) Pulse Rate:  [112-136] 127 (08/22 1500) Resp:  [18-37] 31 (08/22 1500) BP: (152-212)/(91-121) 188/100 (08/22 1500) SpO2:  [89 %-97 %] 90 % (08/22 1500) Last BM Date: 12/14/20  Intake/Output from previous day: 08/21 0701 - 08/22 0700 In: 6825.9 [P.O.:60; I.V.:5465.5; IV Piggyback:1300.4] Out: 1500 [Urine:1500] Intake/Output this shift: No intake/output data recorded.  General appearance: alert,  cooperative, appears stated age, flushed, no distress, and mildly obese Resp: clear to auscultation bilaterally Cardio: regular rate and rhythm, S1, S2 normal, no murmur, click, rub or gallop GI: soft, non-tender; bowel sounds normal; no masses,  no organomegaly Extremities: extremities normal, atraumatic, no cyanosis or edema  Lab Results: Recent Labs    12/15/20 1117 12/16/20 0320 12/17/20 0247  WBC 14.4* 25.9* 22.0*  HGB 15.3 16.2 14.5  HCT 43.5 46.2 41.9  PLT 248 251 187   BMET Recent Labs    12/15/20 1117 12/16/20 0320 12/17/20 0247  NA 141 139 137  K 3.5 3.3* 3.9  CL 111 107 106  CO2 21* 24 24  GLUCOSE 123* 119* 88  BUN 10 9 9   CREATININE 0.94 0.88 0.79  CALCIUM 8.4* 8.2* 7.8*   LFT Recent Labs    12/17/20 0247  PROT 5.7*  ALBUMIN 2.8*  AST 34  ALT 53*  ALKPHOS 90  BILITOT 1.6*   PT/INR Recent Labs    12/15/20 1309 12/16/20 1104  LABPROT 13.3 15.9*  INR 1.0 1.3*   Hepatitis Panel Recent Labs    12/15/20 1309  HEPBSAG NON REACTIVE  HCVAB NON REACTIVE  HEPAIGM NON REACTIVE  HEPBIGM NON REACTIVE   Studies/Results: MR 3D Recon At Scanner  Result Date: 12/15/2020 CLINICAL DATA:  Acute pancreatitis. Severe abdominal pain. Elevated liver function tests. Alcohol dependence. Prior cholecystectomy. EXAM: MRI ABDOMEN WITHOUT AND WITH CONTRAST (INCLUDING MRCP) TECHNIQUE: Multiplanar multisequence MR imaging of the abdomen was performed both before and after the administration of intravenous contrast. Heavily T2-weighted images of the biliary and pancreatic ducts were obtained, and three-dimensional MRCP images were rendered by post processing. CONTRAST:  3mL GADAVIST GADOBUTROL 1 MMOL/ML IV SOLN COMPARISON:  CT on 12/15/2020 FINDINGS: Lower chest: No acute findings. Hepatobiliary: No hepatic masses identified. Mild ascites noted. Prior cholecystectomy. No evidence of biliary ductal dilatation or choledocholithiasis. Pancreas: Diffuse pancreatic swelling, with  mild-to-moderate peripancreatic inflammatory changes and fluid, consistent with acute pancreatitis. A focal area of absent contrast enhancement is seen in the pancreatic tail measuring approximately 3.7 x 2.0 cm, consistent with area of pancreatic necrosis. No evidence of pseudocysts. No evidence of pancreatic mass or pancreatic ductal dilatation. Spleen:  Within normal limits in size and appearance. Adrenals/Urinary Tract: No masses identified. No evidence of hydronephrosis. Stomach/Bowel: Visualized portion unremarkable. Vascular/Lymphatic: No pathologically enlarged lymph nodes identified. Splenic vein thrombosis is again seen with venous collaterals in left upper quadrant. Other:  None. Musculoskeletal:  No suspicious bone lesions identified. IMPRESSION: Moderate to severe acute pancreatitis, with focal area of necrosis in the pancreatic tail. No evidence of pancreatic pseudocyst. Prior cholecystectomy. No evidence of biliary ductal dilatation or choledocholithiasis. Splenic vein thrombosis with left upper quadrant venous collaterals. Electronically Signed   By: Danae Orleans M.D.   On: 12/15/2020 18:18   MR ABDOMEN MRCP W WO CONTAST  Result Date: 12/15/2020 CLINICAL DATA:  Acute pancreatitis. Severe abdominal pain. Elevated liver function tests. Alcohol dependence. Prior cholecystectomy. EXAM: MRI ABDOMEN WITHOUT AND WITH CONTRAST (INCLUDING MRCP) TECHNIQUE: Multiplanar multisequence MR imaging of the abdomen was performed both before and after the administration of intravenous contrast. Heavily T2-weighted images of the biliary and pancreatic ducts were obtained, and three-dimensional MRCP images were rendered by post processing. CONTRAST:  77mL GADAVIST GADOBUTROL 1 MMOL/ML IV SOLN COMPARISON:  CT on 12/15/2020 FINDINGS: Lower chest: No acute findings. Hepatobiliary: No hepatic masses identified. Mild ascites noted. Prior cholecystectomy. No evidence of biliary ductal dilatation or choledocholithiasis.  Pancreas: Diffuse pancreatic swelling, with mild-to-moderate peripancreatic inflammatory changes and fluid, consistent with acute pancreatitis. A focal area of absent contrast enhancement is seen in the pancreatic tail measuring approximately 3.7 x 2.0 cm, consistent with area of pancreatic necrosis. No evidence of pseudocysts. No evidence of pancreatic mass or pancreatic ductal dilatation. Spleen:  Within normal limits in size and appearance. Adrenals/Urinary Tract: No masses identified. No evidence of hydronephrosis. Stomach/Bowel: Visualized portion unremarkable. Vascular/Lymphatic: No pathologically enlarged lymph nodes identified. Splenic vein thrombosis is again seen with venous collaterals in left upper quadrant. Other:  None. Musculoskeletal:  No suspicious bone lesions identified. IMPRESSION: Moderate to severe acute pancreatitis, with focal area of necrosis in the pancreatic tail. No evidence of pancreatic pseudocyst. Prior cholecystectomy. No evidence of biliary ductal dilatation or choledocholithiasis. Splenic vein thrombosis with left upper quadrant venous collaterals. Electronically Signed   By: Danae Orleans M.D.   On: 12/15/2020 18:18    Medications: I have reviewed the patient's current medications.  Assessment/Plan: 1) Acute alcoholic pancreatitis with pancreatic necrosis, fever and tachycardia; WBC up to 22K even though her LFT's and Lipase seem to be improving-patient's is insisting on being discharged tomorrow morning so he can return to [place] and follow-up with his physicians there. I have advised the patient against this as he has severe necrotizing pancreatitis and he has been advised to be discussed with Dr. Irene Limbo tomorrow morning. 2) Alcohol use disorder on CIWA protocol. 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   LOS: 2 days   Charna Elizabeth 12/17/2020, 3:37 PM

## 2020-12-17 NOTE — Progress Notes (Signed)
PROGRESS NOTE  Brian Moran ZOX:[id number] DOB: 1980/10/23 DOA: 12/15/2020 PCP: No primary care provider on file.  Brief History   40 year old man PMH multiple episodes of pancreatitis including gallstone, previous stent, presented to the emergency department with abdominal pain.  Admitted for acute pancreatitis.  MRCP revealed necrosis, patient with low-grade temperature and leukocytosis.  Starting antibiotics.  A & P  Acute alcoholic pancreatitis with necrosis, fever, tachycardia -- Lipase better, LFTs overall trending down, abdominal pain resolved, no nausea or vomiting. --Clinically appears fairly well but remains tachycardic and febrile, with leukocytosis --As per GI clear liquid diet.  Continue aggressive IV fluids, empiric antibiotics.  Remain in stepdown high risk for decompensation.  Alcohol use disorder  -- Continue CIWA.  No signs or symptoms of withdrawal.  Elevated LFTs -- Secondary to alcohol use, trending down.   Chronic splenic vein thrombosis -- Supportive care.  Further recommendations per gastroenterology.  Disposition Plan:  Discussion: Patient remains ill as described above.  Keep in stepdown.  I discussed in detail with the patient current diagnoses, treatment plan and recommendations, gravity of situation including the fact that sometimes people can die from pancreatitis.  He reports understanding but that he will have to leave to fly back home in the next few days. Leaving AMA was discouraged.  Status is: Inpatient  Remains inpatient appropriate because:IV treatments appropriate due to intensity of illness or inability to take PO and Inpatient level of care appropriate due to severity of illness  Dispo: The patient is from: Home              Anticipated d/c is to: Home              Patient currently is not medically stable to d/c.   Difficult to place patient No  DVT prophylaxis: enoxaparin (LOVENOX) injection 40 mg Start: 12/15/20 1800   Code Status:  Full Code Level of care: Stepdown Family Communication: none  Brendia Sacks, MD  [facility] Direct contact: see [email or web address] (further directions at bottom of note if needed) 7PM-7AM contact night coverage as at bottom of note 12/17/2020, 10:07 AM  LOS: 2 days   Significant Hospital Events   8/20 admit for pancreatitis 8/21 MRCP shows necrosis, low grade temp, WBC high; GI consult   Consults:  GI   Procedures:  None  Significant Diagnostic Tests:  MRCP moderate to severe acute pancreatitis w/ focal necrosis of tail   Micro Data:  BC 8/21 NGTD    Antimicrobials:  Merrem 8/21 >    Interval History/Subjective  CC: f/u abd pain  Feels fine, no pain, no vomiting.  Wants to drink some liquids.  Breathing fine.  Objective   Vitals:  Vitals:   12/17/20 0900 12/17/20 0925  BP: (!) 198/99   Pulse: (!) 136   Resp: 18   Temp:  99.6 F (37.6 C)  SpO2: 93%     Exam: Physical Exam Vitals reviewed.  Constitutional:      General: He is in acute distress.     Appearance: He is not ill-appearing or toxic-appearing.  Cardiovascular:     Rate and Rhythm: Regular rhythm. Tachycardia present.     Heart sounds: No murmur heard.    Comments: Telemetry ST Pulmonary:     Effort: Pulmonary effort is normal. No respiratory distress.     Breath sounds: Normal breath sounds. No wheezing or rales.  Abdominal:     General: There is distension.     Palpations: Abdomen is soft.  Tenderness: There is no abdominal tenderness.  Musculoskeletal:     Right lower leg: No edema.     Left lower leg: No edema.  Neurological:     Mental Status: He is alert. Mental status is at baseline.  Psychiatric:        Mood and Affect: Mood normal.    I have personally reviewed the labs and other data, making special note of:   Today's Data  Phosphorus 2.1, lipase down to 56, AST now normal, ALT trending down, total bilirubin stable Minimal decrease in WBC, 22  Scheduled Meds:   Chlorhexidine Gluconate Cloth  6 each Topical Daily   enoxaparin (LOVENOX) injection  40 mg Subcutaneous Q24H   folic acid  1 mg Oral Daily   LORazepam  0-4 mg Oral Q6H   Followed by   LORazepam  0-4 mg Oral Q12H   mouth rinse  15 mL Mouth Rinse BID   sodium chloride flush  3 mL Intravenous Q12H   thiamine  100 mg Oral Daily   Or   thiamine  100 mg Intravenous Daily   Continuous Infusions:  sodium chloride 250 mL/hr at 12/17/20 0320   sodium chloride Stopped (12/16/20 1901)   meropenem (MERREM) IV Stopped (12/17/20 0350)   potassium PHOSPHATE IVPB (in mmol) 20 mmol (12/17/20 0924)    Principal Problem:   Acute pancreatitis Active Problems:   Alcohol use disorder, severe, dependence (HCC)   Transaminitis   History of laparoscopic cholecystectomy   Hyperbilirubinemia   Splenic vein thrombosis   Pancreatic necrosis   LOS: 2 days   How to contact the [facility] Attending or Consulting provider 7A - 7P or covering provider during after hours 7P -7A, for this patient?  Check the care team in [facility] and look for a) attending/consulting TRH provider listed and b) the [facility] team listed Log into [email or web address] and use [place]'s universal password to access. If you do not have the password, please contact the hospital operator. Locate the [facility] provider you are looking for under [facility] and page to a number that you can be directly reached. If you still have difficulty reaching the provider, please page the [facility] (Director on Call) for the Hospitalists listed on amion for assistance.

## 2020-12-18 ENCOUNTER — Inpatient Hospital Stay (HOSPITAL_COMMUNITY)
Admission: EM | Admit: 2020-12-18 | Discharge: 2020-12-21 | DRG: 438 | Disposition: A | Discharge status: Home / Self Care | Payer: 59 | Attending: Internal Medicine | Admitting: Internal Medicine

## 2020-12-18 ENCOUNTER — Encounter (HOSPITAL_COMMUNITY): Payer: Self-pay

## 2020-12-18 ENCOUNTER — Other Ambulatory Visit: Payer: Self-pay

## 2020-12-18 DIAGNOSIS — K8521 Alcohol induced acute pancreatitis with uninfected necrosis: Secondary | ICD-10-CM | POA: Diagnosis not present

## 2020-12-18 DIAGNOSIS — I864 Gastric varices: Secondary | ICD-10-CM

## 2020-12-18 DIAGNOSIS — Z20822 Contact with and (suspected) exposure to covid-19: Secondary | ICD-10-CM | POA: Diagnosis present

## 2020-12-18 DIAGNOSIS — J9601 Acute respiratory failure with hypoxia: Secondary | ICD-10-CM | POA: Diagnosis present

## 2020-12-18 DIAGNOSIS — Z9049 Acquired absence of other specified parts of digestive tract: Secondary | ICD-10-CM

## 2020-12-18 DIAGNOSIS — Z6830 Body mass index (BMI) 30.0-30.9, adult: Secondary | ICD-10-CM

## 2020-12-18 DIAGNOSIS — E669 Obesity, unspecified: Secondary | ICD-10-CM | POA: Diagnosis present

## 2020-12-18 DIAGNOSIS — K852 Alcohol induced acute pancreatitis without necrosis or infection: Secondary | ICD-10-CM | POA: Diagnosis present

## 2020-12-18 DIAGNOSIS — E46 Unspecified protein-calorie malnutrition: Secondary | ICD-10-CM | POA: Diagnosis present

## 2020-12-18 DIAGNOSIS — E876 Hypokalemia: Secondary | ICD-10-CM

## 2020-12-18 DIAGNOSIS — F1020 Alcohol dependence, uncomplicated: Secondary | ICD-10-CM | POA: Diagnosis present

## 2020-12-18 DIAGNOSIS — K8689 Other specified diseases of pancreas: Secondary | ICD-10-CM | POA: Diagnosis present

## 2020-12-18 DIAGNOSIS — J9811 Atelectasis: Secondary | ICD-10-CM | POA: Diagnosis present

## 2020-12-18 DIAGNOSIS — R0602 Shortness of breath: Secondary | ICD-10-CM | POA: Diagnosis not present

## 2020-12-18 DIAGNOSIS — D649 Anemia, unspecified: Secondary | ICD-10-CM | POA: Diagnosis present

## 2020-12-18 DIAGNOSIS — I82890 Acute embolism and thrombosis of other specified veins: Secondary | ICD-10-CM | POA: Diagnosis present

## 2020-12-18 DIAGNOSIS — K861 Other chronic pancreatitis: Secondary | ICD-10-CM | POA: Diagnosis present

## 2020-12-18 LAB — COMPREHENSIVE METABOLIC PANEL
ALT: 35 U/L (ref 0–44)
AST: 21 U/L (ref 15–41)
Albumin: 2.7 g/dL — ABNORMAL LOW (ref 3.5–5.0)
Alkaline Phosphatase: 80 U/L (ref 38–126)
Anion gap: 5 (ref 5–15)
BUN: 6 mg/dL (ref 6–20)
CO2: 25 mmol/L (ref 22–32)
Calcium: 7.6 mg/dL — ABNORMAL LOW (ref 8.9–10.3)
Chloride: 103 mmol/L (ref 98–111)
Creatinine, Ser: 0.65 mg/dL (ref 0.61–1.24)
GFR, Estimated: >60 mL/min (ref >60)
Glucose, Bld: 116 mg/dL — ABNORMAL HIGH (ref 70–99)
Potassium: 3.3 mmol/L — ABNORMAL LOW (ref 3.5–5.1)
Sodium: 133 mmol/L — ABNORMAL LOW (ref 135–145)
Total Bilirubin: 1.3 mg/dL — ABNORMAL HIGH (ref 0.3–1.2)
Total Protein: 5.6 g/dL — ABNORMAL LOW (ref 6.5–8.1)

## 2020-12-18 LAB — CBC
HCT: 35 % — ABNORMAL LOW (ref 39.0–52.0)
Hemoglobin: 12.1 g/dL — ABNORMAL LOW (ref 13.0–17.0)
MCH: 33.2 pg (ref 26.0–34.0)
MCHC: 34.6 g/dL (ref 30.0–36.0)
MCV: 96.2 fL (ref 80.0–100.0)
Platelets: 164 10*3/uL (ref 150–400)
RBC: 3.64 MIL/uL — ABNORMAL LOW (ref 4.22–5.81)
RDW: 14.1 % (ref 11.5–15.5)
WBC: 16 10*3/uL — ABNORMAL HIGH (ref 4.0–10.5)
nRBC: 0 % (ref 0.0–0.2)

## 2020-12-18 LAB — MAGNESIUM: Magnesium: 2.2 mg/dL (ref 1.7–2.4)

## 2020-12-18 LAB — LIPASE, BLOOD: Lipase: 34 U/L (ref 11–51)

## 2020-12-18 LAB — PHOSPHORUS: Phosphorus: 1.5 mg/dL — ABNORMAL LOW (ref 2.5–4.6)

## 2020-12-18 MED ORDER — POTASSIUM PHOSPHATES 15 MMOLE/5ML IV SOLN
30.0000 mmol | Freq: Once | INTRAVENOUS | Status: DC
  Filled 2020-12-18: qty 10

## 2020-12-18 MED ORDER — POTASSIUM CHLORIDE CRYS ER 20 MEQ PO TBCR: 40.0000 meq | EXTENDED_RELEASE_TABLET | Freq: Once | ORAL | Status: DC

## 2020-12-18 NOTE — Discharge Summary (Addendum)
Physician Discharge Summary  Brian Moran ZOX:[id number] DOB: 09/14/1980 DOA: 12/15/2020  PCP: No primary care provider on file.  Admit date: 12/15/2020 Discharge date: 12/18/2020  PATIENT LEFT AMA  Recommendations for Outpatient Follow-up:   Acute alcoholic pancreatitis with necrosis, fever, tachycardia -- Recommend immediate rehospitalization as soon as possible -- Recommend alcohol cessation  Chronic splenic vein thrombosis, gastric varices -- recommend outpatient follow-up   Discharge Diagnoses: Principal diagnosis is #1 Principal Problem:   Acute pancreatitis Active Problems:   Alcohol use disorder, severe, dependence (HCC)   Transaminitis   History of laparoscopic cholecystectomy   Hyperbilirubinemia   Splenic vein thrombosis   Pancreatic necrosis  Discharge Condition: improved Disposition: home  Diet recommendation:  Diet Orders (From admission, onward)     Start     Ordered   12/17/20 1003  Diet clear liquid Room service appropriate? Yes; Fluid consistency: Thin  Diet effective now       Question Answer Comment  Room service appropriate? Yes   Fluid consistency: Thin      12/17/20 1002             Filed Weights   12/15/20 0017 12/15/20 0849 12/16/20 1200  Weight: 79.4 kg 87.7 kg 89.2 kg    HPI/Hospital Course:   40 year old man PMH multiple episodes of pancreatitis including gallstone, previous stent, presented to the emergency department with abdominal pain after drinking alcohol.  Admitted for acute pancreatitis secondary to alcohol.  MRCP revealed necrosis.  Developed fever and tachycardia and transferred to stepdown unit.  Subjectively improved with resolution of pain and tolerating clear liquids.  Respiratory status stable but remains tachycardic.  Low-grade fever.  Seen by gastroenterology with recommendation to continue hospitalization, antibiotics, liquid diet.  8/23 I discussed the case with Dr. Loreta Ave.  We both agree that patient is quite  ill and requires ongoing hospitalization with high risk for decompensation and death without treatment.  The patient reported he was leaving to day no matter what.  I sat down with the patient and RN Fleet Contras at bedside and reviewed his laboratory studies and current diagnoses and treatment plan, I discussed the rationale for ongoing hospitalization and the severity of pancreatic necrosis and possible complications including death.  I discussed the nature of infection, the need for antibiotics.  I also made him aware of the splenic thrombosis and gastric varices which can be life-threatening if hemorrhage occurs.  I I told him that if he leaves the hospital today I would not be surprised to hear that he died, or that his plane was forced to land due to medical emergency prior to arriving at his destination.  I would in face expect it I told him.  I advised him in the strongest possible terms and in the clearest simplest language to remain hospitalized.  I told him to seek immediate medical care. He clearly has capacity, was able to understand the risks and benefits of ongoing hospitalization, repeat them back to me and the nurse and displayed knowledge of his current medical issues.  Acute alcoholic pancreatitis with necrosis, cannot rule out infected, fever, tachycardia -- Clinically and subjectively better with lipase trending down, abdominal pain resolved, LFTs trending down or normalized. --As above discussed with Dr. Isaiah Serge, recommended to continue antibiotics, liquid diet.  Patient refused.   Alcohol use disorder  -- CIWA, no signs or symptoms of withdrawal.   Elevated LFTs -- Secondary to alcohol use, trending down and normalized.   Chronic splenic vein thrombosis, gastric varices --  Supportive care currently.  Patient made fully aware.  Recommend outpatient follow-up.   Significant Hospital Events   8/20 admit for pancreatitis 8/21 MRCP shows necrosis, low grade temp, WBC high; GI  consult 8/23 left AMA   Consults:  GI   Procedures:  None   Significant Diagnostic Tests:  MRCP moderate to severe acute pancreatitis w/ focal necrosis of tail   Micro Data:  BC 8/21 NGTD    Antimicrobials:  Merrem 8/21 >    Today's assessment: S: CC: f/u necrotizing pancreatitis  Patient feels well, tolerating liquids, no pain some abdominal bloating, bowels moving.  Breathing fine. He feels quite well and insists on leaving today.  He reports he needs to fly back home to take care of some financial matters but cannot possibly wait.  He has no one that can assist him with this.  O: Vitals:  Vitals:   12/18/20 0800 12/18/20 0900  BP: (!) 177/109   Pulse: (!) 108 (!) 111  Resp: (!) 30 20  Temp:    SpO2:  96%    Constitutional:  Appears calm and comfortable Respiratory:  CTA bilaterally, no w/r/r.  Respiratory effort normal.  Cardiovascular:  Tachycardic, regular rhythm, no m/r/g No LE extremity edema   Abdomen:  Soft, nontender, mildly distended. Psychiatric:  Mental status Mood, affect unremarkable.  Alert.    Discharge Instructions    No Known Allergies  The results of significant diagnostics from this hospitalization (including imaging, microbiology, ancillary and laboratory) are listed below for reference.    Significant Diagnostic Studies: CT ABDOMEN PELVIS W CONTRAST  Result Date: 12/15/2020 CLINICAL DATA:  Abdominal pain, acute, nonlocalized. History of pancreatitis. EXAM: CT ABDOMEN AND PELVIS WITH CONTRAST TECHNIQUE: Multidetector CT imaging of the abdomen and pelvis was performed using the standard protocol following bolus administration of intravenous contrast. CONTRAST:  OMNIPAQUE IOHEXOL 300 MG/ML  SOLN COMPARISON:  None. FINDINGS: Lower chest: Mild bibasilar atelectasis. The visualized heart and pericardium are unremarkable. Hepatobiliary: No focal liver abnormality is seen. Status post cholecystectomy. No biliary dilatation. Pancreas:  There is mild peripancreatic inflammatory change extending into the porta hepatis and into the anterior pararenal spaces bilaterally as well as the mesentery in keeping with changes of acute edematous/interstitial pancreatitis. Normal enhancement of the pancreatic parenchyma. Pancreatic duct is not dilated. Punctate nonspecific calcifications are seen within the pancreatic head, possibly the sequela of remote pancreatitis. No in capsulated peripancreatic fluid collections or necrosis. No peripancreatic adenopathy. Spleen: Unremarkable Adrenals/Urinary Tract: Adrenal glands are unremarkable. Kidneys are normal, without renal calculi, focal lesion, or hydronephrosis. Bladder is unremarkable. Stomach/Bowel: Periduodenal inflammatory change likely relates to adjacent pancreatitis. Stomach is within normal limits. Appendix appears normal. No other evidence of bowel wall thickening, distention, or inflammatory changes. No free intraperitoneal gas or fluid. Vascular/Lymphatic: There is chronic thrombosis of the splenic vein with numerous varices identified within the gastric fundus. Superior mesenteric vein and portal vein are patent. The abdominal vasculature is otherwise unremarkable. Reproductive: Prostate is unremarkable. Other: No abdominal wall hernia identified.  Rectum unremarkable. Musculoskeletal: No acute or significant osseous findings. IMPRESSION: Mild acute interstitial/edematous pancreatitis. No evidence of pancreatic or peripancreatic necrosis. 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 Electronically Signed   By: Helyn Numbers M.D.   On: 12/15/2020 02:55   MR 3D Recon At Scanner  Result Date: 12/15/2020 CLINICAL DATA:  Acute pancreatitis. Severe abdominal pain. Elevated liver function tests. Alcohol dependence. Prior cholecystectomy. EXAM: MRI ABDOMEN  WITHOUT AND WITH CONTRAST (INCLUDING MRCP) TECHNIQUE: Multiplanar  multisequence MR imaging of the abdomen was performed both before and after the administration of intravenous contrast. Heavily T2-weighted images of the biliary and pancreatic ducts were obtained, and three-dimensional MRCP images were rendered by post processing. CONTRAST:  62mL GADAVIST GADOBUTROL 1 MMOL/ML IV SOLN COMPARISON:  CT on 12/15/2020 FINDINGS: Lower chest: No acute findings. Hepatobiliary: No hepatic masses identified. Mild ascites noted. Prior cholecystectomy. No evidence of biliary ductal dilatation or choledocholithiasis. Pancreas: Diffuse pancreatic swelling, with mild-to-moderate peripancreatic inflammatory changes and fluid, consistent with acute pancreatitis. A focal area of absent contrast enhancement is seen in the pancreatic tail measuring approximately 3.7 x 2.0 cm, consistent with area of pancreatic necrosis. No evidence of pseudocysts. No evidence of pancreatic mass or pancreatic ductal dilatation. Spleen:  Within normal limits in size and appearance. Adrenals/Urinary Tract: No masses identified. No evidence of hydronephrosis. Stomach/Bowel: Visualized portion unremarkable. Vascular/Lymphatic: No pathologically enlarged lymph nodes identified. Splenic vein thrombosis is again seen with venous collaterals in left upper quadrant. Other:  None. Musculoskeletal:  No suspicious bone lesions identified. IMPRESSION: Moderate to severe acute pancreatitis, with focal area of necrosis in the pancreatic tail. No evidence of pancreatic pseudocyst. Prior cholecystectomy. No evidence of biliary ductal dilatation or choledocholithiasis. Splenic vein thrombosis with left upper quadrant venous collaterals. Electronically Signed   By: Danae Orleans M.D.   On: 12/15/2020 18:18   MR ABDOMEN MRCP W WO CONTAST  Result Date: 12/15/2020 CLINICAL DATA:  Acute pancreatitis. Severe abdominal pain. Elevated liver function tests. Alcohol dependence. Prior cholecystectomy. EXAM: MRI ABDOMEN WITHOUT AND WITH CONTRAST  (INCLUDING MRCP) TECHNIQUE: Multiplanar multisequence MR imaging of the abdomen was performed both before and after the administration of intravenous contrast. Heavily T2-weighted images of the biliary and pancreatic ducts were obtained, and three-dimensional MRCP images were rendered by post processing. CONTRAST:  57mL GADAVIST GADOBUTROL 1 MMOL/ML IV SOLN COMPARISON:  CT on 12/15/2020 FINDINGS: Lower chest: No acute findings. Hepatobiliary: No hepatic masses identified. Mild ascites noted. Prior cholecystectomy. No evidence of biliary ductal dilatation or choledocholithiasis. Pancreas: Diffuse pancreatic swelling, with mild-to-moderate peripancreatic inflammatory changes and fluid, consistent with acute pancreatitis. A focal area of absent contrast enhancement is seen in the pancreatic tail measuring approximately 3.7 x 2.0 cm, consistent with area of pancreatic necrosis. No evidence of pseudocysts. No evidence of pancreatic mass or pancreatic ductal dilatation. Spleen:  Within normal limits in size and appearance. Adrenals/Urinary Tract: No masses identified. No evidence of hydronephrosis. Stomach/Bowel: Visualized portion unremarkable. Vascular/Lymphatic: No pathologically enlarged lymph nodes identified. Splenic vein thrombosis is again seen with venous collaterals in left upper quadrant. Other:  None. Musculoskeletal:  No suspicious bone lesions identified. IMPRESSION: Moderate to severe acute pancreatitis, with focal area of necrosis in the pancreatic tail. No evidence of pancreatic pseudocyst. Prior cholecystectomy. No evidence of biliary ductal dilatation or choledocholithiasis. Splenic vein thrombosis with left upper quadrant venous collaterals. Electronically Signed   By: Danae Orleans M.D.   On: 12/15/2020 18:18    Microbiology: Recent Results (from the past 240 hour(s))  Resp Panel by RT-PCR (Flu A&B, Covid) Nasopharyngeal Swab     Status: None   Collection Time: 12/15/20  2:10 AM   Specimen:  Nasopharyngeal Swab; Nasopharyngeal(NP) swabs in vial transport medium  Result Value Ref Range Status   SARS Coronavirus 2 by RT PCR NEGATIVE NEGATIVE Final    Comment: (NOTE) SARS-CoV-2 target nucleic acids are NOT DETECTED.  The SARS-CoV-2 RNA is generally detectable in upper  respiratory specimens during the acute phase of infection. The lowest concentration of SARS-CoV-2 viral copies this assay can detect is 138 copies/mL. A negative result does not preclude SARS-Cov-2 infection and should not be used as the sole basis for treatment or other patient management decisions. A negative result may occur with  improper specimen collection/handling, submission of specimen other than nasopharyngeal swab, presence of viral mutation(s) within the areas targeted by this assay, and inadequate number of viral copies(<138 copies/mL). A negative result must be combined with clinical observations, patient history, and epidemiological information. The expected result is Negative.  Fact Sheet for Patients:  BloggerCourse.comhttps://www.fda.gov/media/152166/download  Fact Sheet for Healthcare Providers:  SeriousBroker.ithttps://www.fda.gov/media/152162/download  This test is no t yet approved or cleared by the [place] FDA and  has been authorized for detection and/or diagnosis of SARS-CoV-2 by FDA under an Emergency Use Authorization (EUA). This EUA will remain  in effect (meaning this test can be used) for the duration of the COVID-19 declaration under Section 564(b)(1) of the Act, 21 U.S.C.section 360bbb-3(b)(1), unless the authorization is terminated  or revoked sooner.       Influenza A by PCR NEGATIVE NEGATIVE Final   Influenza B by PCR NEGATIVE NEGATIVE Final    Comment: (NOTE) The Xpert Xpress SARS-CoV-2/FLU/RSV plus assay is intended as an aid in the diagnosis of influenza from Nasopharyngeal swab specimens and should not be used as a sole basis for treatment. Nasal washings and aspirates are unacceptable for  Xpert Xpress SARS-CoV-2/FLU/RSV testing.  Fact Sheet for Patients: BloggerCourse.comhttps://www.fda.gov/media/152166/download  Fact Sheet for Healthcare Providers: SeriousBroker.ithttps://www.fda.gov/media/152162/download  This test is not yet approved or cleared by the [place] FDA and has been authorized for detection and/or diagnosis of SARS-CoV-2 by FDA under an Emergency Use Authorization (EUA). This EUA will remain in effect (meaning this test can be used) for the duration of the COVID-19 declaration under Section 564(b)(1) of the Act, 21 U.S.C. section 360bbb-3(b)(1), unless the authorization is terminated or revoked.  Performed at [facility] [place], [address] [place], [place] [address]   Culture, blood (Routine X 2) w Reflex to ID Panel     Status: None (Preliminary result)   Collection Time: 12/16/20 10:01 AM   Specimen: BLOOD  Result Value Ref Range Status   Specimen Description   Final    BLOOD LEFT ANTECUBITAL Performed at [facility], [address] [place], [place] [address]    Special Requests   Final    BOTTLES DRAWN AEROBIC AND ANAEROBIC Blood Culture adequate volume Performed at [facility], [address] [place], [place] [address]    Culture   Final    NO GROWTH 2 DAYS Performed at [facility], [address] [place], [place] [address]    Report Status PENDING  Incomplete  Culture, blood (Routine X 2) w Reflex to ID Panel     Status: None (Preliminary result)   Collection Time: 12/16/20 10:01 AM   Specimen: BLOOD  Result Value Ref Range Status   Specimen Description   Final    BLOOD BLOOD LEFT WRIST Performed at [facility], [address] [place], [place] [address]    Special Requests   Final    BOTTLES DRAWN AEROBIC ONLY Blood Culture adequate volume Performed at [facility], [address] [place], [place] [address]    Culture   Final    NO GROWTH 2 DAYS Performed at  [facility],  [address] [place], [place] [address]    Report Status PENDING  Incomplete  MRSA Next Gen by PCR, Nasal     Status: None   Collection Time: 12/16/20  4:04 PM   Specimen: Nasal Mucosa; Nasal Swab  Result Value Ref Range Status   MRSA by PCR Next Gen NOT DETECTED NOT DETECTED Final    Comment: (NOTE) The GeneXpert MRSA Assay (FDA approved for NASAL specimens only), is one component of a comprehensive MRSA colonization surveillance program. It is not intended to diagnose MRSA infection nor to guide or monitor treatment for MRSA infections. Test performance is not FDA approved in patients less than 32 years old. Performed at [facility], [address], [place], [place] [address]      Labs: Basic Metabolic Panel: Recent Labs  Lab 12/15/20 0056 12/15/20 1117 12/16/20 0320 12/17/20 0247 12/18/20 0235  NA 138 141 139 137 133*  K 3.4* 3.5 3.3* 3.9 3.3*  CL 104 111 107 106 103  CO2 21* 21* 24 24 25   GLUCOSE 132* 123* 119* 88 116*  BUN 13 10 9 9 6   CREATININE 0.94 0.94 0.88 0.79 0.65  CALCIUM 8.6* 8.4* 8.2* 7.8* 7.6*  MG 1.7 1.6*  --  2.0 2.2  PHOS 3.9 3.7  --  2.1* 1.5*   Liver Function Tests: Recent Labs  Lab 12/15/20 0056 12/15/20 1117 12/16/20 0320 12/17/20 0247 12/18/20 0235  AST 25 441* 106* 34 21  ALT 24 167* 110* 53* 35  ALKPHOS 68 87 100 90 80  BILITOT 0.9 2.6* 1.7* 1.6* 1.3*  PROT 7.1 6.5 6.0* 5.7* 5.6*  ALBUMIN 4.1 3.9 3.3* 2.8* 2.7*   Recent Labs  Lab 12/15/20 0056 12/17/20 0247 12/18/20 0235  LIPASE 686* 56* 34    CBC: Recent Labs  Lab 12/15/20 0056 12/15/20 1117 12/16/20 0320 12/17/20 0247 12/18/20 0235  WBC 17.1* 14.4* 25.9* 22.0* 16.0*  HGB 15.8 15.3 16.2 14.5 12.1*  HCT 42.5 43.5 46.2 41.9 35.0*  MCV 90.6 94.8 94.3 97.7 96.2  PLT 304 248 251 187 164    Principal Problem:   Acute pancreatitis Active Problems:   Alcohol use disorder, severe, dependence (HCC)   Transaminitis   History of  laparoscopic cholecystectomy   Hyperbilirubinemia   Splenic vein thrombosis   Pancreatic necrosis   Time coordinating discharge: 35 minutes  Signed:  12/19/20, MD  [facility]  12/18/2020, 11:19 AM

## 2020-12-18 NOTE — ED Triage Notes (Signed)
Pt reports being admitted in the ICU but leaving AMA. Pt states that his family wanted him to come back to make sure that he was okay. Pt reports hx of pancreatitis and reports pain 4/10.

## 2020-12-18 NOTE — Progress Notes (Signed)
RN at bedside during MD-patient interaction. Patient expressed to Orlin Hilding that he wants to leave. MD advised against leaving and explained the risks of leaving to patient. Patient verbalized understanding of risks and repeated risks back to this RN and MD. Patient still stated that he is leaving today. Patient stated to this RN and Ned Grace RN, "I have some stuff to take care of and if I die doing it, so be it." This RN and Ned Grace, RN reiterated the risks of leaving against medical advice. Patient verbalized understanding again. PIVs removed and patient dressed. RN ambulated patient downstairs to main entrance. Patient safely waiting for ride at main entrance.

## 2020-12-18 NOTE — ED Provider Notes (Signed)
Emergency Medicine Provider Triage Evaluation Note  Brian Moran , a 40 y.o. male  was evaluated in triage.  Pt complains of pancreatitis. Was admitted for acute alcoholic pancreatitis with necrosis. Pt left AMA today. Was recommend  To stay for continued antibiotics. Patient states that he feels good, was able to eat when he got home but then started to feel a fever when he got home - states that it as 105. States that his wife got made at him and made him come back here.  Review of Systems  Positive: Abdominal pain, fevers, nausea  Negative: Vomiting, diarrhea   Physical Exam  BP (!) 162/110   Pulse 96   Temp 98.9 F (37.2 C) (Oral)   Resp 16   SpO2 98%  Gen:   Awake, no distress   Resp:  Normal effort  MSK:   Moves extremities without difficulty  Other:    Medical Decision Making  Medically screening exam initiated at 10:15 PM.  Appropriate orders placed.  Brian Moran was informed that the remainder of the evaluation will be completed by another provider, this initial triage assessment does not replace that evaluation, and the importance of remaining in the ED until their evaluation is complete.     Farrel Gordon, PA-C 12/18/20 2220    Derwood Kaplan, MD 12/19/20 [phone]

## 2020-12-19 ENCOUNTER — Inpatient Hospital Stay (HOSPITAL_COMMUNITY): Payer: 59

## 2020-12-19 DIAGNOSIS — R0602 Shortness of breath: Secondary | ICD-10-CM | POA: Diagnosis present

## 2020-12-19 DIAGNOSIS — K852 Alcohol induced acute pancreatitis without necrosis or infection: Secondary | ICD-10-CM | POA: Diagnosis present

## 2020-12-19 DIAGNOSIS — E669 Obesity, unspecified: Secondary | ICD-10-CM | POA: Diagnosis present

## 2020-12-19 DIAGNOSIS — Z20822 Contact with and (suspected) exposure to covid-19: Secondary | ICD-10-CM | POA: Diagnosis present

## 2020-12-19 DIAGNOSIS — K8522 Alcohol induced acute pancreatitis with infected necrosis: Secondary | ICD-10-CM

## 2020-12-19 DIAGNOSIS — I864 Gastric varices: Secondary | ICD-10-CM | POA: Diagnosis present

## 2020-12-19 DIAGNOSIS — Z9049 Acquired absence of other specified parts of digestive tract: Secondary | ICD-10-CM | POA: Diagnosis not present

## 2020-12-19 DIAGNOSIS — Z6830 Body mass index (BMI) 30.0-30.9, adult: Secondary | ICD-10-CM | POA: Diagnosis not present

## 2020-12-19 DIAGNOSIS — K861 Other chronic pancreatitis: Secondary | ICD-10-CM | POA: Diagnosis present

## 2020-12-19 DIAGNOSIS — J9811 Atelectasis: Secondary | ICD-10-CM | POA: Diagnosis present

## 2020-12-19 DIAGNOSIS — K8689 Other specified diseases of pancreas: Secondary | ICD-10-CM

## 2020-12-19 DIAGNOSIS — I82890 Acute embolism and thrombosis of other specified veins: Secondary | ICD-10-CM

## 2020-12-19 DIAGNOSIS — E876 Hypokalemia: Secondary | ICD-10-CM

## 2020-12-19 DIAGNOSIS — K8521 Alcohol induced acute pancreatitis with uninfected necrosis: Secondary | ICD-10-CM | POA: Diagnosis present

## 2020-12-19 DIAGNOSIS — J9601 Acute respiratory failure with hypoxia: Secondary | ICD-10-CM | POA: Diagnosis present

## 2020-12-19 DIAGNOSIS — E46 Unspecified protein-calorie malnutrition: Secondary | ICD-10-CM | POA: Diagnosis present

## 2020-12-19 DIAGNOSIS — D649 Anemia, unspecified: Secondary | ICD-10-CM | POA: Diagnosis present

## 2020-12-19 DIAGNOSIS — F1020 Alcohol dependence, uncomplicated: Secondary | ICD-10-CM

## 2020-12-19 LAB — RESP PANEL BY RT-PCR (FLU A&B, COVID) ARPGX2
Influenza A by PCR: NEGATIVE
Influenza B by PCR: NEGATIVE
SARS Coronavirus 2 by RT PCR: NEGATIVE

## 2020-12-19 LAB — COMPREHENSIVE METABOLIC PANEL
ALT: 40 U/L (ref 0–44)
ALT: 39 U/L (ref 0–44)
AST: 39 U/L (ref 15–41)
AST: 35 U/L (ref 15–41)
Albumin: 2.7 g/dL — ABNORMAL LOW (ref 3.5–5.0)
Albumin: 2.6 g/dL — ABNORMAL LOW (ref 3.5–5.0)
Alkaline Phosphatase: 115 U/L (ref 38–126)
Alkaline Phosphatase: 114 U/L (ref 38–126)
Anion gap: 7 (ref 5–15)
Anion gap: 7 (ref 5–15)
BUN: 6 mg/dL (ref 6–20)
BUN: 6 mg/dL (ref 6–20)
CO2: 26 mmol/L (ref 22–32)
CO2: 25 mmol/L (ref 22–32)
Calcium: 8.2 mg/dL — ABNORMAL LOW (ref 8.9–10.3)
Calcium: 8 mg/dL — ABNORMAL LOW (ref 8.9–10.3)
Chloride: 106 mmol/L (ref 98–111)
Chloride: 106 mmol/L (ref 98–111)
Creatinine, Ser: 0.72 mg/dL (ref 0.61–1.24)
Creatinine, Ser: 0.69 mg/dL (ref 0.61–1.24)
GFR, Estimated: >60 mL/min (ref >60)
GFR, Estimated: >60 mL/min (ref >60)
Glucose, Bld: 94 mg/dL (ref 70–99)
Glucose, Bld: 94 mg/dL (ref 70–99)
Potassium: 2.8 mmol/L — ABNORMAL LOW (ref 3.5–5.1)
Potassium: 2.9 mmol/L — ABNORMAL LOW (ref 3.5–5.1)
Sodium: 139 mmol/L (ref 135–145)
Sodium: 138 mmol/L (ref 135–145)
Total Bilirubin: 0.7 mg/dL (ref 0.3–1.2)
Total Bilirubin: 0.9 mg/dL (ref 0.3–1.2)
Total Protein: 6.1 g/dL — ABNORMAL LOW (ref 6.5–8.1)
Total Protein: 5.9 g/dL — ABNORMAL LOW (ref 6.5–8.1)

## 2020-12-19 LAB — CBC WITH DIFFERENTIAL/PLATELET
Abs Immature Granulocytes: 0.06 10*3/uL (ref 0.00–0.07)
Basophils Absolute: 0 10*3/uL (ref 0.0–0.1)
Basophils Relative: 0 %
Eosinophils Absolute: 0.2 10*3/uL (ref 0.0–0.5)
Eosinophils Relative: 2 %
HCT: 31 % — ABNORMAL LOW (ref 39.0–52.0)
Hemoglobin: 11.2 g/dL — ABNORMAL LOW (ref 13.0–17.0)
Immature Granulocytes: 1 %
Lymphocytes Relative: 10 %
Lymphs Abs: 1.3 10*3/uL (ref 0.7–4.0)
MCH: 34 pg (ref 26.0–34.0)
MCHC: 36.1 g/dL — ABNORMAL HIGH (ref 30.0–36.0)
MCV: 94.2 fL (ref 80.0–100.0)
Monocytes Absolute: 1 10*3/uL (ref 0.1–1.0)
Monocytes Relative: 8 %
Neutro Abs: 10.2 10*3/uL — ABNORMAL HIGH (ref 1.7–7.7)
Neutrophils Relative %: 79 %
Platelets: 198 10*3/uL (ref 150–400)
RBC: 3.29 MIL/uL — ABNORMAL LOW (ref 4.22–5.81)
RDW: 13.9 % (ref 11.5–15.5)
WBC: 12.9 10*3/uL — ABNORMAL HIGH (ref 4.0–10.5)
nRBC: 0 % (ref 0.0–0.2)

## 2020-12-19 LAB — ETHANOL: Alcohol, Ethyl (B): <10 mg/dL (ref <10)

## 2020-12-19 LAB — LIPASE, BLOOD: Lipase: 40 U/L (ref 11–51)

## 2020-12-19 LAB — PHOSPHORUS: Phosphorus: 1.7 mg/dL — ABNORMAL LOW (ref 2.5–4.6)

## 2020-12-19 LAB — MAGNESIUM: Magnesium: 2.3 mg/dL (ref 1.7–2.4)

## 2020-12-19 LAB — LACTIC ACID, PLASMA: Lactic Acid, Venous: 1 mmol/L (ref 0.5–1.9)

## 2020-12-19 MED ORDER — LORAZEPAM 1 MG PO TABS: 0.0000 mg | ORAL_TABLET | Freq: Two times a day (BID) | ORAL | Status: DC

## 2020-12-19 MED ORDER — MORPHINE SULFATE (PF) 2 MG/ML IV SOLN
1.0000 mg | INTRAVENOUS | Status: DC | PRN
  Administered 2020-12-19 – 2020-12-21 (×10): 1 mg via INTRAVENOUS
  Filled 2020-12-19 (×10): qty 1

## 2020-12-19 MED ORDER — ACETAMINOPHEN 325 MG PO TABS
650.0000 mg | ORAL_TABLET | Freq: Four times a day (QID) | ORAL | Status: DC | PRN
  Administered 2020-12-19 – 2020-12-20 (×3): 650 mg via ORAL
  Filled 2020-12-19 (×3): qty 2

## 2020-12-19 MED ORDER — SODIUM CHLORIDE 0.9 % IV SOLN
1.0000 g | Freq: Three times a day (TID) | INTRAVENOUS | Status: DC
  Administered 2020-12-19 – 2020-12-21 (×7): 1 g via INTRAVENOUS
  Filled 2020-12-19 (×9): qty 1

## 2020-12-19 MED ORDER — ONDANSETRON HCL 4 MG/2ML IJ SOLN
4.0000 mg | Freq: Four times a day (QID) | INTRAMUSCULAR | Status: DC | PRN
  Administered 2020-12-19: 4 mg via INTRAVENOUS
  Filled 2020-12-19: qty 2

## 2020-12-19 MED ORDER — LACTATED RINGERS IV SOLN: INTRAVENOUS | Status: DC

## 2020-12-19 MED ORDER — ONDANSETRON HCL 4 MG/2ML IJ SOLN
4.0000 mg | Freq: Once | INTRAMUSCULAR | Status: AC
  Administered 2020-12-19: 4 mg via INTRAVENOUS
  Filled 2020-12-19: qty 2

## 2020-12-19 MED ORDER — LORAZEPAM 2 MG/ML IJ SOLN: 0.0000 mg | Freq: Four times a day (QID) | INTRAMUSCULAR | Status: AC

## 2020-12-19 MED ORDER — LORAZEPAM 1 MG PO TABS
0.0000 mg | ORAL_TABLET | Freq: Four times a day (QID) | ORAL | Status: AC
  Administered 2020-12-19: 1 mg via ORAL
  Filled 2020-12-19: qty 1

## 2020-12-19 MED ORDER — ADULT MULTIVITAMIN W/MINERALS CH
1.0000 | ORAL_TABLET | Freq: Every day | ORAL | Status: DC
  Administered 2020-12-19 – 2020-12-21 (×3): 1 via ORAL
  Filled 2020-12-19 (×3): qty 1

## 2020-12-19 MED ORDER — LEVALBUTEROL HCL 0.63 MG/3ML IN NEBU: 0.6300 mg | INHALATION_SOLUTION | Freq: Four times a day (QID) | RESPIRATORY_TRACT | Status: DC | PRN

## 2020-12-19 MED ORDER — POTASSIUM CHLORIDE CRYS ER 20 MEQ PO TBCR
40.0000 meq | EXTENDED_RELEASE_TABLET | Freq: Once | ORAL | Status: AC
  Administered 2020-12-19: 40 meq via ORAL
  Filled 2020-12-19: qty 2

## 2020-12-19 MED ORDER — SODIUM CHLORIDE 0.9 % IV BOLUS
1000.0000 mL | Freq: Once | INTRAVENOUS | Status: AC
  Administered 2020-12-19: 1000 mL via INTRAVENOUS

## 2020-12-19 MED ORDER — IPRATROPIUM BROMIDE 0.02 % IN SOLN
0.5000 mg | Freq: Four times a day (QID) | RESPIRATORY_TRACT | Status: DC
  Administered 2020-12-19 (×2): 0.5 mg via RESPIRATORY_TRACT
  Filled 2020-12-19 (×2): qty 2.5

## 2020-12-19 MED ORDER — IPRATROPIUM BROMIDE 0.02 % IN SOLN: 0.5000 mg | Freq: Four times a day (QID) | RESPIRATORY_TRACT | Status: DC | PRN

## 2020-12-19 MED ORDER — THIAMINE HCL 100 MG PO TABS
100.0000 mg | ORAL_TABLET | Freq: Once | ORAL | Status: AC
  Administered 2020-12-19: 100 mg via ORAL
  Filled 2020-12-19: qty 1

## 2020-12-19 MED ORDER — LORAZEPAM 2 MG/ML IJ SOLN: 0.0000 mg | Freq: Two times a day (BID) | INTRAMUSCULAR | Status: DC

## 2020-12-19 MED ORDER — POTASSIUM CHLORIDE 10 MEQ/100ML IV SOLN
10.0000 meq | INTRAVENOUS | Status: AC
  Administered 2020-12-19 (×4): 10 meq via INTRAVENOUS
  Filled 2020-12-19 (×3): qty 100

## 2020-12-19 MED ORDER — K PHOS MONO-SOD PHOS DI & MONO 155-852-130 MG PO TABS
500.0000 mg | ORAL_TABLET | Freq: Two times a day (BID) | ORAL | Status: AC
  Administered 2020-12-19 (×2): 500 mg via ORAL
  Filled 2020-12-19 (×2): qty 2

## 2020-12-19 MED ORDER — FOLIC ACID 1 MG PO TABS
1.0000 mg | ORAL_TABLET | Freq: Every day | ORAL | Status: DC
  Administered 2020-12-19 – 2020-12-21 (×3): 1 mg via ORAL
  Filled 2020-12-19 (×3): qty 1

## 2020-12-19 MED ORDER — MORPHINE SULFATE (PF) 4 MG/ML IV SOLN
4.0000 mg | Freq: Once | INTRAVENOUS | Status: AC
  Administered 2020-12-19: 4 mg via INTRAVENOUS
  Filled 2020-12-19: qty 1

## 2020-12-19 MED ORDER — POTASSIUM CHLORIDE CRYS ER 10 MEQ PO TBCR
40.0000 meq | EXTENDED_RELEASE_TABLET | Freq: Two times a day (BID) | ORAL | Status: AC
  Administered 2020-12-19 (×2): 40 meq via ORAL
  Filled 2020-12-19: qty 2
  Filled 2020-12-19: qty 4

## 2020-12-19 MED ORDER — LEVALBUTEROL HCL 0.63 MG/3ML IN NEBU
0.6300 mg | INHALATION_SOLUTION | Freq: Four times a day (QID) | RESPIRATORY_TRACT | Status: DC
  Administered 2020-12-19 (×2): 0.63 mg via RESPIRATORY_TRACT
  Filled 2020-12-19 (×2): qty 3

## 2020-12-19 MED ORDER — THIAMINE HCL 100 MG PO TABS
100.0000 mg | ORAL_TABLET | Freq: Every day | ORAL | Status: DC
  Administered 2020-12-19 – 2020-12-21 (×3): 100 mg via ORAL
  Filled 2020-12-19 (×3): qty 1

## 2020-12-19 MED ORDER — POTASSIUM CHLORIDE 10 MEQ/100ML IV SOLN
10.0000 meq | INTRAVENOUS | Status: AC
  Administered 2020-12-19 (×2): 10 meq via INTRAVENOUS
  Filled 2020-12-19 (×2): qty 100

## 2020-12-19 MED ORDER — ENOXAPARIN SODIUM 40 MG/0.4ML IJ SOSY
40.0000 mg | PREFILLED_SYRINGE | INTRAMUSCULAR | Status: DC
  Administered 2020-12-19 – 2020-12-20 (×2): 40 mg via SUBCUTANEOUS
  Filled 2020-12-19 (×2): qty 0.4

## 2020-12-19 NOTE — Progress Notes (Signed)
UNASSIGNED PATIENT Subjective: Since I last evaluated the patient, he left the hospital AMA as he wanted to return back to [place] but as he developed a fever shortly after discharge he came back to the emergency room to be rehospitalized and therefore interim consult is being done. Patient is a 40 y.o. male with a longstanding history of alcohol abuse, history of alcohol induced pancreatitis and history of biliary pancreatitis in 02/2019 resulting in a  cholecystectomy and ERCP. ERCP at that time showed biliary stricture and stent was placed which was subsequently removed during repeat ERCP in February 2021; follow-up.  ERCP did not reveal any evidence of biliary stricture.  The patient was visiting his mother in [facility] and was drinking heavily as he was on vacation and had been consuming heavy fried foods when he developed severe abdominal pain requiring him to come to the emergency room at [facility] on 12/15/2020. He was also admitted to the hospital in January 2022 for alcohol induced pancreatitis. Blood work showed elevated lipase at 686, normal LFTs, and leukocytosis. CT abdomen pelvis with contrast done on admission revealed mild pancreatitis with chronic splenic vein thrombosis.  He had an MRCP done on 12/15/20 that revealed moderate to severe pancreatitis with focal areas of necrosis in the pancreatic tail but no evidence of pancreatic pseudocyst.  There was evidence of a prior cholecystomy with no ductal dilation or choledocholithiasis splenic vein thrombosis with left upper quadrant venous collaterals were noted along with evidence of gastric varices. Patient subsequently developed elevated LFTs with T bili of 2.6 AST 441 and ALT of 167. He is claims he is feeling better today has not had any fevers or tachycardia. His abdominal pain is resolved and is having regular bowel movements.  He is tolerating a full liquid diet well and is very keen on traveling home as soon as possible.   There is no history of melena hematochezia.  He has had 2 well-formed BMs today.  On labs his white count is down to 12.9K along with a normal lipase and normal lactic acid levels.   Objective: Vital signs in last 24 hours: Temp:  [98.5 F (36.9 C)-98.9 F (37.2 C)] 98.5 F (36.9 C) (08/24 1425) Pulse Rate:  [87-102] 95 (08/24 1425) Resp:  [16-29] 18 (08/24 1425) BP: (151-167)/(82-110) 154/84 (08/24 1425) SpO2:  [82 %-100 %] 98 % (08/24 1425)    Intake/Output from previous day: 08/23 0701 - 08/24 0700 In: 1200 [IV Piggyback:1200] Out: -  Intake/Output this shift: Total I/O In: 300 [IV Piggyback:300] Out: -   General appearance: alert, cooperative, appears stated age, flushed, no distress, and mildly obese Resp: clear to auscultation bilaterally Cardio: regular rate and rhythm, S1, S2 normal, no murmur, click, rub or gallop GI: soft, non-tender; bowel sounds normal; no masses,  no organomegaly Extremities: extremities normal, atraumatic, no cyanosis or edema  Lab Results: Recent Labs    12/17/20 0247 12/18/20 0235 12/19/20 0100  WBC 22.0* 16.0* 12.9*  HGB 14.5 12.1* 11.2*  HCT 41.9 35.0* 31.0*  PLT 187 164 198   BMET Recent Labs    12/18/20 0235 12/19/20 0100 12/19/20 0528  NA 133* 139 138  K 3.3* 2.8* 2.9*  CL 103 106 106  CO2 25 26 25   GLUCOSE 116* 94 94  BUN 6 6 6   CREATININE 0.65 0.72 0.69  CALCIUM 7.6* 8.2* 8.0*   LFT Recent Labs    12/19/20 0528  PROT 5.9*  ALBUMIN 2.6*  AST 35  ALT 39  ALKPHOS 114  BILITOT 0.9   Studies/Results: DG CHEST PORT 1 VIEW  Result Date: 12/19/2020 CLINICAL DATA:  Can not take a deep breath EXAM: PORTABLE CHEST 1 VIEW COMPARISON:  None. FINDINGS: Retrocardiac density versus underexposure. No pleural effusion. No pneumothorax. Subsegmental atelectasis the left lower lung. No acute osseous abnormality. IMPRESSION: Retrocardiac density may be due to patient positioning/technique. Recommend two upright views of the  chest for better evaluation. Subsegmental atelectasis in the left lower lung. Electronically Signed   By: Olive Bass M.D.   On: 12/19/2020 09:42    Medications: I have reviewed the patient's current medications. Prior to Admission:  Medications Prior to Admission  Medication Sig Dispense Refill Last Dose   ibuprofen (ADVIL) 200 MG tablet Take 400 mg by mouth every 6 (six) hours as needed for moderate pain.   12/18/2020   Scheduled:  enoxaparin (LOVENOX) injection  40 mg Subcutaneous Q24H   folic acid  1 mg Oral Daily   ipratropium  0.5 mg Nebulization Q6H   levalbuterol  0.63 mg Nebulization Q6H   LORazepam  0-4 mg Intravenous Q6H   Or   LORazepam  0-4 mg Oral Q6H   [START ON 12/21/2020] LORazepam  0-4 mg Intravenous Q12H   Or   [START ON 12/21/2020] LORazepam  0-4 mg Oral Q12H   multivitamin with minerals  1 tablet Oral Daily   phosphorus  500 mg Oral BID   potassium chloride  40 mEq Oral BID   thiamine  100 mg Oral Daily   Assessment/Plan: 1) Acute alcoholic pancreatitis with necrosis in the pancreatic tail-continue supportive care for now along with full liquid diet.  Correct electrolytes including hypokalemia and hypophosphatemia. Leukocytosis leukocytosis seems to be improving considerably. 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 3) Chronic alcohol use with malnutrition-patient will need active intervention once he returns home to [place] if he is serious about stopping the use of all alcohol beverages.  LOS: 0 days   Charna Elizabeth 12/19/2020, 2:43 PM

## 2020-12-19 NOTE — ED Provider Notes (Signed)
[facility]-EMERGENCY DEPT Provider Note   CSN: [id number] Arrival date & time: 12/18/20  2041     History Chief Complaint  Patient presents with   Pancreatitis    Brian Moran is a 40 y.o. male who presents with pancreatitis. Patient was hospitalized for acute alcoholic pancreatitis with necrosis and left AMA this morning. Since leaving the hospital he felt feverish and his wife told him to be reevaluated. He states temperature spiked to 101F, he took tylenol and it came down. He complains of epigastric pain, rates it 5/10, with associated nausea and greasy loose stools. Has been tolerating liquids at home.   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 No pertinent family history.  Social History   Tobacco Use   Smoking status: Never   Smokeless tobacco: Never  Substance Use Topics   Alcohol use: Yes   Drug use: Never    Home Medications Prior to Admission medications   Medication Sig Start Date End Date Taking? Authorizing Provider  ibuprofen (ADVIL) 200 MG tablet Take 400 mg by mouth every 6 (six) hours as needed for moderate pain.   Yes [provider]    Allergies    Patient has no known allergies.  Review of Systems   Review of Systems  Constitutional:  Positive for chills and fever. Negative for appetite change.  Respiratory:  Negative for  shortness of breath.   Cardiovascular:  Negative for chest pain.  Gastrointestinal:  Positive for abdominal distention, abdominal pain, diarrhea and nausea. Negative for constipation and vomiting.  Genitourinary:  Negative for dysuria, flank pain and frequency.  All other systems reviewed and are negative.  Physical Exam Updated Vital Signs BP (!) 151/90 (BP Location: Left Arm)   Pulse 96   Temp 98.9 F (37.2 C) (Oral)   Resp 16   SpO2 96%   Physical Exam Vitals and nursing note reviewed.  Constitutional:      Appearance: Normal appearance.  HENT:     Head: Normocephalic and atraumatic.  Eyes:     Conjunctiva/sclera: Conjunctivae normal.  Cardiovascular:     Rate and Rhythm: Normal rate and regular rhythm.  Pulmonary:     Effort: Pulmonary effort is normal. No respiratory distress.     Breath sounds: Normal breath sounds.  Abdominal:     General: Bowel sounds are normal. There is distension.     Palpations: Abdomen is soft.     Tenderness: There is abdominal tenderness.     Comments: Tenderness to palpation of epigastrium  Skin:    General: Skin is warm and dry.  Neurological:     General: No focal deficit present.     Mental Status: He is alert and oriented to person, place, and time.    ED Results / Procedures / Treatments   Labs (all labs ordered are listed, but only abnormal results are displayed) Labs  Reviewed  CBC WITH DIFFERENTIAL/PLATELET - Abnormal; Notable for the following components:      Result Value   WBC 12.9 (*)    RBC 3.29 (*)    Hemoglobin 11.2 (*)    HCT 31.0 (*)    MCHC 36.1 (*)    Neutro Abs 10.2 (*)    All other components within normal limits  COMPREHENSIVE METABOLIC PANEL - Abnormal; Notable for the following components:   Potassium 2.8 (*)    Calcium 8.2 (*)    Total Protein 6.1 (*)    Albumin 2.7 (*)    All other components within normal limits  RESP PANEL BY RT-PCR (FLU A&B, COVID) ARPGX2  CULTURE, BLOOD (ROUTINE X 2)  CULTURE,  BLOOD (ROUTINE X 2)  LIPASE, BLOOD  LACTIC ACID, PLASMA  MAGNESIUM  ETHANOL  COMPREHENSIVE METABOLIC PANEL    EKG None  Radiology No results found.  Procedures Procedures   Medications Ordered in ED Medications  meropenem (MERREM) 1 g in sodium chloride 0.9 % 100 mL IVPB (0 g Intravenous Stopped 12/19/20 0221)  potassium chloride 10 mEq in 100 mL IVPB (10 mEq Intravenous New Bag/Given 12/19/20 0236)  LORazepam (ATIVAN) injection 0-4 mg (0 mg Intravenous Hold 12/19/20 0236)    Or  LORazepam (ATIVAN) tablet 0-4 mg ( Oral See Alternative 12/19/20 0236)  LORazepam (ATIVAN) injection 0-4 mg (has no administration in time range)    Or  LORazepam (ATIVAN) tablet 0-4 mg (has no administration in time range)  potassium chloride SA (KLOR-CON) CR tablet 40 mEq (has no administration in time range)  enoxaparin (LOVENOX) injection 40 mg (has no administration in time range)  lactated ringers infusion (has no administration in time range)  morphine 2 MG/ML injection 1 mg (has no administration in time range)  ondansetron (ZOFRAN) injection 4 mg (has no administration in time range)  sodium chloride 0.9 % bolus 1,000 mL (1,000 mLs Intravenous New Bag/Given 12/19/20 0100)  ondansetron (ZOFRAN) injection 4 mg (4 mg Intravenous Given 12/19/20 0100)  morphine 4 MG/ML injection 4 mg (4 mg Intravenous Given 12/19/20 0101)  thiamine tablet 100 mg (100 mg Oral Given 12/19/20 0235)    ED Course  I have reviewed the triage vital signs and the nursing notes.  Pertinent labs & imaging results that were available during my care of the patient were reviewed by me and considered in my medical decision making (see chart for details).    MDM Rules/Calculators/A&P                           Patient is 40 y/o male who presents with acute alcoholic pancreatitis with necrosis after leaving hospital AMA yesterday. Patient was started on empiric IV antibiotics, LFTs and lipase were improving. Since returning home  patient was tolerating liquids and soft foods, reported temperature of 101 F and took tylenol. Reports nausea, greasy stools.   CBC shows leukocytosis of 16, improved from labs prior to discharge. Hgb down to 12.1 from 14.5. CMP showed potassium 2.8, given 40 mEq potassium. Lipase 40, lactate 1.0, blood cultures pending.   Restarted meropenem 1g IV Q8, consult for readmission.  Final Clinical Impression(s) / ED Diagnoses Final diagnoses:  Alcohol-induced acute pancreatitis, unspecified complication status    Rx / DC Orders ED Discharge Orders     None        Su Monks, PA-C 12/19/20 0443    Sabas Sous, MD 12/19/20 [phone]

## 2020-12-19 NOTE — Progress Notes (Signed)
Care started prior to midnight in the emergency room and patient was admitted early this morning after midnight by Dr. John Giovanni and I am in current agreement with her assessment and plan.  Additional changes to the plan of care have been made accordingly.  The patient is a 40 year old obese male with past medical history significant for but not limited to alcohol use disorder, 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  After leaving the hospital he did fine and was able to drink Gatorade and eat food without experience any abdominal pain or vomiting.  He continued some greasy bowel movements.  In the evening he had a low-grade temperature of 99-100 for which she took Tylenol for his wife encouraged him to come back to the hospital to be reevaluated.  He represented for his pancreatitis and blood pressure remained elevated.  Labs showed improving leukocytosis but he did have a potassium of 2.8 and renal function was stable.  He did have a normal lipase and LFTs.  Lactic acid level was normal.  Blood cultures were drawn and he is screened again for COVID and flu which was negative.  He was initiated on a CIWA protocol and also was given IV morphine, Zofran, meropenem, IV potassium 20 mEQ and 1 L normal saline bolus.  Currently he is being admitted and treated for the following but not limited to:  Acute alcoholic pancreatitis with necrosis -MRCP done 4 days ago showing moderate to severe acute pancreatitis with focal area of necrosis in the pancreatic tail; no pseudocyst. -Afebrile here but patient reports low-grade fever at home. -Labs showing improving leukocytosis as WBC has gone from 22,000 and trended down to 16.0 is now 12.9.  Normal lipase and LFTs.  Lactic acid normal. -Not endorsing any abdominal pain,  nausea, or vomiting. -Continue meropenem 1 g every 8 hours scheduled for intra-abdominal infection -Continue with supportive care and continue with IV morphine 1 mg every 4 hours as needed severe pain -Continue with antiemetics with IV Zofran 4 mg every 6 as needed for nausea vomiting -Repeat blood cultures pending -IV fluid hydration with lactated Ringer's at 150 mL per hour -Clear liquid diet -Dr. Loney Loh has sent a secure chat message to Dr. Loreta Ave requesting consultation this a.m.; follow-up on gastroenterology recommendations but for now we will continue clear liquid diet this a.m. and advance diet as tolerated and go to a full liquid diet for lunch  Acute respiratory failure with hypoxia -Noted to be desaturating to 82% on room air and complained of shortness of breath -Check chest x-ray and initiate on Xopenex and Atrovent scheduled -We will add flutter valve and incentive spirometry   Hypokalemia -EKG, telemetry monitoring -K+ is now 2.9 -Magnesium level normal at 2.3 -Replete potassium again with IV KCl 40 mEq as well as 40 mEq p.o. twice daily x2 doses -Continue to monitor electrolytes  Hypophosphatemia -Patient's phosphorus level of 1.7 -Replete with p.o. K-Phos Neutral 500 mg twice daily x2 doses -Continue monitor and replete as necessary--repeat phosphorus level in a.m.  Normocytic Anemia -Likely dilutional drop with IV fluid hydration -patient's hemoglobin/hematocrit went from 14.5/41.9 -> 12.1/35.0 -> 11.2/31.0 -Check anemia panel in a.m. -Continue to monitor for signs and symptoms bleeding; currently no overt bleeding noted -Repeat CBC in a.m.   Alcohol use disorder -No signs or symptoms of withdrawal -CIWA protocol we will  add thiamine, multivitamin with minerals and folic acid daily   Chronic splenic vein thrombosis, gastric varices -Seen on recent imaging -Continue with supportive care -Further recommendations per gastroenterology  Obesity -Complicates  overall prognosis and care -Estimated body mass index is 30.8 kg/m as calculated from the following:   Height as of 12/16/20: 5\' 7"  (1.702 m).   Weight as of 12/16/20: 89.2 kg. -Weight Loss and Dietary Counseling given   We will continue to monitor the patient's clinical response to intervention and repeat blood work and imaging in the a.m. and follow-up on his chest x-ray and specialist recommendations

## 2020-12-19 NOTE — Progress Notes (Signed)
Pt seen and respiratory assessment performed.  No wheezes auscultated.  Pt does not want scheduled nebulizer treatments and prefers to call instead.  Rx changed to prn per pt request.

## 2020-12-19 NOTE — Plan of Care (Signed)

## 2020-12-19 NOTE — H&P (Signed)
History and Physical    Brian Moran BPZ:[id number] DOB: March 10, 1981 DOA: 12/18/2020  PCP: Pcp, No Patient coming from: Home  Chief Complaint: Pancreatitis  HPI: Brian Moran is a 40 y.o. male with medical history significant of alcohol use disorder, recurrent pancreatitis including gallstone, previous stent.  He was admitted to the hospital for acute alcoholic pancreatitis with necrosis and left AMA yesterday.  Patient states after leaving the hospital yesterday he did fine.  He was able to drink Gatorade and eat fruit without experiencing any abdominal pain or vomiting.  He continues to have greasy bowel movements.  In the evening he had a low-grade temperature of 99-100 F for which he took a Tylenol and his wife encouraged him to come back to the hospital to be reevaluated.  No other complaints.  ED Course: Blood pressure elevated, remainder of vital signs stable.  Labs showing improving leukocytosis.  Potassium 2.8.  Renal function stable.  Normal lipase and LFTs.  Lactic acid normal.  Blood cultures drawn.  Screening COVID/flu test negative. Patient was started on CIWA protocol.  He was given morphine, Zofran, meropenem, IV potassium 10 mEq x 2, and 1 L normal saline bolus.  Review of Systems:  All systems reviewed and apart from history of presenting illness, are negative.  Past Medical History:  Diagnosis Date   Pancreatitis     Past Surgical History:  Procedure Laterality Date   CHOLECYSTECTOMY       reports that he has never smoked. He has never used smokeless tobacco. He reports current alcohol use. He reports that he does not use drugs.  No Known Allergies  History reviewed. No pertinent family history.  Prior to Admission medications   Medication Sig Start Date End Date Taking? Authorizing Provider  ibuprofen (ADVIL) 200 MG tablet Take 400 mg by mouth every 6 (six) hours as needed for moderate pain.   Yes [provider]    Physical Exam: Vitals:    12/18/20 2122 12/19/20 0021 12/19/20 0030 12/19/20 0233  BP: (!) 162/110 (!) 162/99 (!) 159/103 (!) 151/90  Pulse: 96 92 90 96  Resp:  18 20 16   Temp: 98.9 F (37.2 C)     TempSrc: Oral     SpO2: 98% 100% 96% 96%    Physical Exam Constitutional:      General: He is not in acute distress. HENT:     Head: Normocephalic and atraumatic.  Eyes:     Extraocular Movements: Extraocular movements intact.     Conjunctiva/sclera: Conjunctivae normal.  Cardiovascular:     Rate and Rhythm: Normal rate and regular rhythm.     Pulses: Normal pulses.  Pulmonary:     Effort: Pulmonary effort is normal. No respiratory distress.     Breath sounds: Normal breath sounds. No wheezing or rales.  Abdominal:     General: Bowel sounds are normal.     Palpations: Abdomen is soft.     Tenderness: There is no abdominal tenderness. There is no guarding or rebound.  Musculoskeletal:        General: No swelling or tenderness.     Cervical back: Normal range of motion and neck supple.  Skin:    General: Skin is warm and dry.  Neurological:     General: No focal deficit present.     Mental Status: He is alert and oriented to person, place, and time.     Labs on Admission: I have personally reviewed following labs and imaging studies  CBC: Recent Labs  Lab 12/15/20 1117 12/16/20 0320 12/17/20 0247 12/18/20 0235 12/19/20 0100  WBC 14.4* 25.9* 22.0* 16.0* 12.9*  NEUTROABS  --   --   --   --  10.2*  HGB 15.3 16.2 14.5 12.1* 11.2*  HCT 43.5 46.2 41.9 35.0* 31.0*  MCV 94.8 94.3 97.7 96.2 94.2  PLT 248 251 187 164 198   Basic Metabolic Panel: Recent Labs  Lab 12/15/20 0056 12/15/20 1117 12/16/20 0320 12/17/20 0247 12/18/20 0235 12/19/20 0100  NA 138 141 139 137 133* 139  K 3.4* 3.5 3.3* 3.9 3.3* 2.8*  CL 104 111 107 106 103 106  CO2 21* 21* 24 24 25 26   GLUCOSE 132* 123* 119* 88 116* 94  BUN 13 10 9 9 6 6   CREATININE 0.94 0.94 0.88 0.79 0.65 0.72  CALCIUM 8.6* 8.4* 8.2* 7.8* 7.6*  8.2*  MG 1.7 1.6*  --  2.0 2.2 2.3  PHOS 3.9 3.7  --  2.1* 1.5*  --    GFR: Estimated Creatinine Clearance: 128.1 mL/min (by C-G formula based on SCr of 0.72 mg/dL). Liver Function Tests: Recent Labs  Lab 12/15/20 1117 12/16/20 0320 12/17/20 0247 12/18/20 0235 12/19/20 0100  AST 441* 106* 34 21 39  ALT 167* 110* 53* 35 40  ALKPHOS 87 100 90 80 115  BILITOT 2.6* 1.7* 1.6* 1.3* 0.7  PROT 6.5 6.0* 5.7* 5.6* 6.1*  ALBUMIN 3.9 3.3* 2.8* 2.7* 2.7*   Recent Labs  Lab 12/15/20 0056 12/17/20 0247 12/18/20 0235 12/19/20 0100  LIPASE 686* 56* 34 40   No results for input(s): AMMONIA in the last 168 hours. Coagulation Profile: Recent Labs  Lab 12/15/20 1309 12/16/20 1104  INR 1.0 1.3*   Cardiac Enzymes: No results for input(s): CKTOTAL, CKMB, CKMBINDEX, TROPONINI in the last 168 hours. BNP (last 3 results) No results for input(s): PROBNP in the last 8760 hours. HbA1C: No results for input(s): HGBA1C in the last 72 hours. CBG: No results for input(s): GLUCAP in the last 168 hours. Lipid Profile: No results for input(s): CHOL, HDL, LDLCALC, TRIG, CHOLHDL, LDLDIRECT in the last 72 hours. Thyroid Function Tests: No results for input(s): TSH, T4TOTAL, FREET4, T3FREE, THYROIDAB in the last 72 hours. Anemia Panel: No results for input(s): VITAMINB12, FOLATE, FERRITIN, TIBC, IRON, RETICCTPCT in the last 72 hours. Urine analysis: No results found for: COLORURINE, APPEARANCEUR, LABSPEC, PHURINE, GLUCOSEU, HGBUR, BILIRUBINUR, KETONESUR, PROTEINUR, UROBILINOGEN, NITRITE, LEUKOCYTESUR  Radiological Exams on Admission: No results found.  Assessment/Plan Principal Problem:   Alcoholic pancreatitis Active Problems:   Alcohol use disorder, severe, dependence (HCC)   Splenic vein thrombosis   Pancreatic necrosis   Hypokalemia   Acute alcoholic pancreatitis with necrosis -MRCP done 4 days ago showing moderate to severe acute pancreatitis with focal area of necrosis in the  pancreatic tail; no pseudocyst. -Afebrile here but patient reports low-grade fever at home. -Labs showing improving leukocytosis.  Normal lipase and LFTs.  Lactic acid normal. -Not endorsing any abdominal pain, nausea, or vomiting. -Continue meropenem -Repeat blood cultures pending -IV fluid hydration -Clear liquid diet -I have sent a secure chat message to Dr. 12/17/20 requesting consultation in a.m.  Hypokalemia -EKG, telemetry monitoring -Magnesium level normal -Replete potassium -Continue to monitor electrolytes  Alcohol use disorder -No signs or symptoms of withdrawal -CIWA protocol  Chronic splenic vein thrombosis, gastric varices -Seen on recent imaging -Supportive care -Further recommendations per gastroenterology  DVT prophylaxis: Lovenox Code Status: Full code Family Communication: No family available at this time.  Disposition Plan: Status is: Inpatient  Remains inpatient appropriate because:Inpatient level of care appropriate due to severity of illness  Dispo: The patient is from: Home              Anticipated d/c is to: Home              Patient currently is not medically stable to d/c.   Difficult to place patient No  Level of care: Level of care: Telemetry  The medical decision making on this patient was of high complexity and the patient is at high risk for clinical deterioration, therefore this is a level 3 visit.  John Giovanni MD [facility]  If 7PM-7AM, please contact night-coverage [email or web address]  12/19/2020, 3:22 AM

## 2020-12-19 NOTE — Progress Notes (Signed)
Pharmacy Antibiotic Note  Brian Moran is a 40 y.o. male admitted on 12/18/2020 with complains of pancreatitis. Was admitted for acute alcoholic pancreatitis with necrosis. Pt left AMA today. Was recommend To stay for continued antibiotics. Patient states that he feels good, was able to eat when he got home but then started to feel a fever when he got home - states that it as 105. States that his wife got made at him and made him come back here. Pharmacy has been consulted for to dose merrem for intra-abdominal infection.  Plan: Merrem 1gm IV q8h Follow renal function and clinical course     Temp (24hrs), Avg:98.8 F (37.1 C), Min:98.6 F (37 C), Max:98.9 F (37.2 C)  Recent Labs  Lab 12/15/20 1117 12/16/20 0320 12/16/20 1104 12/16/20 1251 12/17/20 0247 12/18/20 0235 12/19/20 0100  WBC 14.4* 25.9*  --   --  22.0* 16.0* 12.9*  CREATININE 0.94 0.88  --   --  0.79 0.65 0.72  LATICACIDVEN  --   --  1.3 0.9  --   --  1.0    Estimated Creatinine Clearance: 128.1 mL/min (by C-G formula based on SCr of 0.72 mg/dL).    No Known Allergies   Thank you for allowing pharmacy to be a part of this patient's care.  Arley Phenix RPh 12/19/2020, 3:35 AM

## 2020-12-20 DIAGNOSIS — D649 Anemia, unspecified: Secondary | ICD-10-CM

## 2020-12-20 LAB — RETICULOCYTES
Immature Retic Fract: 18.1 % — ABNORMAL HIGH (ref 2.3–15.9)
RBC.: 3.22 MIL/uL — ABNORMAL LOW (ref 4.22–5.81)
Retic Count, Absolute: 48.9 10*3/uL (ref 19.0–186.0)
Retic Ct Pct: 1.5 % (ref 0.4–3.1)

## 2020-12-20 LAB — IRON AND TIBC
Iron: 14 ug/dL — ABNORMAL LOW (ref 45–182)
Saturation Ratios: 6 % — ABNORMAL LOW (ref 17.9–39.5)
TIBC: 230 ug/dL — ABNORMAL LOW (ref 250–450)
UIBC: 216 ug/dL

## 2020-12-20 LAB — COMPREHENSIVE METABOLIC PANEL
ALT: 40 U/L (ref 0–44)
AST: 34 U/L (ref 15–41)
Albumin: 2.4 g/dL — ABNORMAL LOW (ref 3.5–5.0)
Alkaline Phosphatase: 139 U/L — ABNORMAL HIGH (ref 38–126)
Anion gap: 6 (ref 5–15)
BUN: <5 mg/dL — ABNORMAL LOW (ref 6–20)
CO2: 27 mmol/L (ref 22–32)
Calcium: 8.3 mg/dL — ABNORMAL LOW (ref 8.9–10.3)
Chloride: 105 mmol/L (ref 98–111)
Creatinine, Ser: 0.6 mg/dL — ABNORMAL LOW (ref 0.61–1.24)
GFR, Estimated: >60 mL/min (ref >60)
Glucose, Bld: 123 mg/dL — ABNORMAL HIGH (ref 70–99)
Potassium: 3.4 mmol/L — ABNORMAL LOW (ref 3.5–5.1)
Sodium: 138 mmol/L (ref 135–145)
Total Bilirubin: 0.8 mg/dL (ref 0.3–1.2)
Total Protein: 5.7 g/dL — ABNORMAL LOW (ref 6.5–8.1)

## 2020-12-20 LAB — FERRITIN: Ferritin: 520 ng/mL — ABNORMAL HIGH (ref 24–336)

## 2020-12-20 LAB — CBC WITH DIFFERENTIAL/PLATELET
Abs Immature Granulocytes: 0.06 10*3/uL (ref 0.00–0.07)
Basophils Absolute: 0 10*3/uL (ref 0.0–0.1)
Basophils Relative: 0 %
Eosinophils Absolute: 0.2 10*3/uL (ref 0.0–0.5)
Eosinophils Relative: 2 %
HCT: 30.9 % — ABNORMAL LOW (ref 39.0–52.0)
Hemoglobin: 10.9 g/dL — ABNORMAL LOW (ref 13.0–17.0)
Immature Granulocytes: 1 %
Lymphocytes Relative: 15 %
Lymphs Abs: 1.6 10*3/uL (ref 0.7–4.0)
MCH: 33.6 pg (ref 26.0–34.0)
MCHC: 35.3 g/dL (ref 30.0–36.0)
MCV: 95.4 fL (ref 80.0–100.0)
Monocytes Absolute: 1 10*3/uL (ref 0.1–1.0)
Monocytes Relative: 10 %
Neutro Abs: 7.4 10*3/uL (ref 1.7–7.7)
Neutrophils Relative %: 72 %
Platelets: 204 10*3/uL (ref 150–400)
RBC: 3.24 MIL/uL — ABNORMAL LOW (ref 4.22–5.81)
RDW: 14.6 % (ref 11.5–15.5)
WBC: 10.2 10*3/uL (ref 4.0–10.5)
nRBC: 0 % (ref 0.0–0.2)

## 2020-12-20 LAB — PHOSPHORUS: Phosphorus: 3.2 mg/dL (ref 2.5–4.6)

## 2020-12-20 LAB — FOLATE: Folate: 12.6 ng/mL (ref >5.9)

## 2020-12-20 LAB — MAGNESIUM: Magnesium: 2.1 mg/dL (ref 1.7–2.4)

## 2020-12-20 LAB — VITAMIN B12: Vitamin B-12: 261 pg/mL (ref 180–914)

## 2020-12-20 MED ORDER — POTASSIUM CHLORIDE CRYS ER 10 MEQ PO TBCR
40.0000 meq | EXTENDED_RELEASE_TABLET | Freq: Two times a day (BID) | ORAL | Status: AC
  Administered 2020-12-20 (×2): 40 meq via ORAL
  Filled 2020-12-20 (×2): qty 4

## 2020-12-20 NOTE — Progress Notes (Signed)
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  After leaving the hospital he did fine and was able to drink Gatorade and eat food without experience any abdominal pain or vomiting.  He continued some greasy bowel movements.  In the evening he had a low-grade temperature of 99-100 for which she took Tylenol for his wife encouraged him to come back to the hospital to be reevaluated.  He represented for his pancreatitis and blood pressure remained elevated.  Labs showed improving leukocytosis but he did have a potassium of 2.8 and renal function was stable.  He did have a normal lipase and LFTs.  Lactic acid level was normal.  Blood cultures were drawn and he is screened again for COVID and flu which was negative.  He was initiated on a CIWA protocol and also was given IV morphine, Zofran, meropenem, IV potassium 20 mEQ and 1 L normal saline bolus.  Currently he is being admitted and treated for Acute Alcoholic Pancreatitis with Necrosis  Assessment & Plan:   Principal Problem:   Alcoholic pancreatitis Active Problems:   Alcohol use disorder, severe, dependence (HCC)   Splenic vein thrombosis   Pancreatic necrosis   Hypokalemia  Acute alcoholic pancreatitis with necrosis -MRCP done 5 days ago showing moderate to severe acute pancreatitis with focal area of necrosis in the pancreatic tail; no pseudocyst. -Afebrile here but patient reports low-grade fever at home for which he took Acetaminophen for -Labs showing improving leukocytosis as WBC has gone from 22,000 and trended  down to 16.0 is now 12.9 -> 10.2.  Normal lipase and LFTs.  Lactic acid normal. -Not endorsing any abdominal pain, nausea, or vomiting. -Continue meropenem 1 g every 8 hours scheduled for intra-abdominal infection for now -Continue with supportive care and continue with IV morphine 1 mg every 4 hours as needed severe pain -Continue with antiemetics with IV Zofran 4 mg every 6 as needed for nausea vomiting -Repeat blood cultures pending -IV fluid hydration with lactated Ringer's at 150 mL per hour -Diet further advanced to regular diet and per GI if tolerates his diet can be discharged tomorrow a.m. -GI evaluated    Acute respiratory failure with hypoxia, improved  -Noted to be desaturating to 82% on room air and complained of shortness of breath yesterday but is improved -Check chest x-ray and initiate on Xopenex and Atrovent scheduled -CXR revealed "Retrocardiac density may be due to patient positioning/technique. Recommend two upright views of the chest for better evaluation. Subsegmental atelectasis in the left lower lung." -We will add flutter valve and incentive spirometry   Hypokalemia -EKG, telemetry monitoring -K+ is now 3.4 -Magnesium level normal at 2.3 -Replete potassium with KCl 40 mEq p.o. twice daily x2 doses -Continue to monitor electrolytes   Hypophosphatemia -Patient's phosphorus level of 1.7 yesterday and is now improved to 3.2 -Replete with p.o. K-Phos Neutral 500 mg twice daily x2 doses yesterday -Continue monitor and replete as necessary -Repeat phosphorus level in a.m.  Normocytic Anemia -Likely dilutional drop with IV fluid hydration -patient's hemoglobin/hematocrit went from 14.5/41.9 -> 12.1/35.0 -> 11.2/31.0 -> 10.9/30.9 -Checked Anemia Panel and  showed an iron level of 14, U IBC of 216, TIBC of 230, saturation ratios of 6%, ferritin level 520, folate level 12.6, vitamin B12 261 -Continue to monitor for signs and symptoms bleeding; currently no overt  bleeding noted -Repeat CBC in a.m.   Alcohol use disorder -No signs or symptoms of withdrawal -CIWA protocol we will add thiamine, multivitamin with minerals and folic acid daily   Chronic splenic vein thrombosis, gastric varices -Seen on recent imaging -Continue with supportive care -Further recommendations per gastroenterology and GI recommending outpatient follow-up for further management and follow-up thrombosis as he does not live in [place] and this will need to be done in [place]   Obesity -Complicates overall prognosis and care -Estimated body mass index is 30.8 kg/m as calculated from the following:   Height as of 12/16/20: 5\' 7"  (1.702 m).   Weight as of 12/16/20: 89.2 kg. -Weight Loss and Dietary Counseling given    DVT prophylaxis: Enoxaparin 40 mg sq q24h Code Status: FULL CODE Family Communication: No family present at bedside  Disposition Plan: Anticipating discharging home in next 24 to 48 hours  Status is: Inpatient  Remains inpatient appropriate because:Unsafe d/c plan, IV treatments appropriate due to intensity of illness or inability to take PO, and Inpatient level of care appropriate due to severity of illness  Dispo: The patient is from: Home              Anticipated d/c is to: Home              Patient currently is not medically stable to d/c.   Difficult to place patient No  Consultants:  Gastroenterology   Procedures: None  Antimicrobials:  Anti-infectives (From admission, onward)    Start     Dose/Rate Route Frequency Ordered Stop   12/19/20 0045  meropenem (MERREM) 1 g in sodium chloride 0.9 % 100 mL IVPB        1 g 200 mL/hr over 30 Minutes Intravenous Every 8 hours 12/19/20 0028          Subjective: Seen and examined at bedside and he is doing much better and tolerating his diet.  Felt okay.  Abdominal pain is improving.  No chest pain or shortness of breath.  No lightheadedness or dizziness.  No other concerns or complaints at this  time.  Objective: Vitals:   12/19/20 1607 12/19/20 2025 12/19/20 2356 12/20/20 0354  BP: (!) 157/95 (!) 149/99 (!) 162/97 (!) 149/96  Pulse: (!) 101 99 87 79  Resp: 18 20 20 20   Temp: 99.8 F (37.7 C) 99.1 F (37.3 C) 99.7 F (37.6 C) 98.7 F (37.1 C)  TempSrc: Oral Oral Oral Oral  SpO2: 97% 96% 98% 94%    Intake/Output Summary (Last 24 hours) at 12/20/2020 Last data filed at 12/20/2020 0700 Gross per 24 hour  Intake 1535.77 ml  Output --  Net 1535.77 ml   There were no vitals filed for this visit.  Examination: Physical Exam:  Constitutional: WN/WD obese Caucasian male currently in NAD and appears calm and comfortable Eyes: Lids and conjunctivae normal, sclerae anicteric  ENMT: External Ears, Nose appear normal. Grossly normal hearing.  Neck: Appears normal, supple, no cervical masses, normal ROM, no appreciable thyromegaly; no JVD Respiratory: Diminished to auscultation bilaterally with coarse breath sounds, no wheezing, rales, rhonchi or crackles. Normal respiratory effort and patient is not tachypenic. No accessory muscle use.  Cardiovascular: RRR, no murmurs / rubs / gallops. S1 and S2 auscultated. No  extremity edema.  Abdomen: Soft, non-tender, distended secondary body habitus. Bowel sounds positive.  GU: Deferred. Musculoskeletal: No clubbing / cyanosis of digits/nails. No joint deformity upper and lower extremities. Skin: No rashes, lesions, ulcers on limited skin evaluation. No induration; Warm and dry.  Neurologic: CN 2-12 grossly intact with no focal deficits. Romberg sign and cerebellar reflexes not assessed.  Psychiatric: Normal judgment and insight. Alert and oriented x 3. Normal mood and appropriate affect.   Data Reviewed: I have personally reviewed following labs and imaging studies  CBC: Recent Labs  Lab 12/16/20 0320 12/17/20 0247 12/18/20 0235 12/19/20 0100 12/20/20 0500  WBC 25.9* 22.0* 16.0* 12.9* 10.2  NEUTROABS  --   --   --  10.2* 7.4   HGB 16.2 14.5 12.1* 11.2* 10.9*  HCT 46.2 41.9 35.0* 31.0* 30.9*  MCV 94.3 97.7 96.2 94.2 95.4  PLT 251 187 164 198 204   Basic Metabolic Panel: Recent Labs  Lab 12/15/20 1117 12/16/20 0320 12/17/20 0247 12/18/20 0235 12/19/20 0100 12/19/20 0528 12/20/20 0500  NA 141   < > 137 133* 139 138 138  K 3.5   < > 3.9 3.3* 2.8* 2.9* 3.4*  CL 111   < > 106 103 106 106 105  CO2 21*   < > GLUCOSE 123*   < > 88 116* 94 94 123*  BUN 10   < > <5*  CREATININE 0.94   < > 0.79 0.65 0.72 0.69 0.60*  CALCIUM 8.4*   < > 7.8* 7.6* 8.2* 8.0* 8.3*  MG 1.6*  --  2.0 2.2 2.3  --  2.1  PHOS 3.7  --  2.1* 1.5*  --  1.7* 3.2   < > = values in this interval not displayed.   GFR: Estimated Creatinine Clearance: 128.1 mL/min (A) (by C-G formula based on SCr of 0.6 mg/dL (L)). Liver Function Tests: Recent Labs  Lab 12/17/20 0247 12/18/20 0235 12/19/20 0100 12/19/20 0528 12/20/20 0500  AST 34 21 39 35 34  ALT 53* 35 40 39 40  ALKPHOS 90 80 115 114 139*  BILITOT 1.6* 1.3* 0.7 0.9 0.8  PROT 5.7* 5.6* 6.1* 5.9* 5.7*  ALBUMIN 2.8* 2.7* 2.7* 2.6* 2.4*   Recent Labs  Lab 12/15/20 0056 12/17/20 0247 12/18/20 0235 12/19/20 0100  LIPASE 686* 56* 34 40   No results for input(s): AMMONIA in the last 168 hours. Coagulation Profile: Recent Labs  Lab 12/15/20 1309 12/16/20 1104  INR 1.0 1.3*   Cardiac Enzymes: No results for input(s): CKTOTAL, CKMB, CKMBINDEX, TROPONINI in the last 168 hours. BNP (last 3 results) No results for input(s): PROBNP in the last 8760 hours. HbA1C: No results for input(s): HGBA1C in the last 72 hours. CBG: No results for input(s): GLUCAP in the last 168 hours. Lipid Profile: No results for input(s): CHOL, HDL, LDLCALC, TRIG, CHOLHDL, LDLDIRECT in the last 72 hours. Thyroid Function Tests: No results for input(s): TSH, T4TOTAL, FREET4, T3FREE, THYROIDAB in the last 72 hours. Anemia Panel: Recent Labs    12/20/20 0500  VITAMINB12 261   FOLATE 12.6  FERRITIN 520*  TIBC 230*  IRON 14*  RETICCTPCT 1.5   Sepsis Labs: Recent Labs  Lab 12/16/20 1104 12/16/20 1251 12/19/20 0100  LATICACIDVEN 1.3 0.9 1.0    Recent Results (from the past 240 hour(s))  Resp Panel by RT-PCR (Flu A&B, Covid) Nasopharyngeal Swab     Status: None   Collection Time: 12/15/20  2:10 AM   Specimen: Nasopharyngeal Swab; Nasopharyngeal(NP) swabs in vial transport medium  Result Value Ref Range Status   SARS Coronavirus 2 by RT PCR NEGATIVE NEGATIVE Final    Comment: (NOTE) SARS-CoV-2 target nucleic acids are NOT DETECTED.  The SARS-CoV-2 RNA is generally detectable in upper respiratory specimens during the acute phase of infection. The lowest concentration of SARS-CoV-2 viral copies this assay can detect is 138 copies/mL. A negative result does not preclude SARS-Cov-2 infection and should not be used as the sole basis for treatment or other patient management decisions. A negative result may occur with  improper specimen collection/handling, submission of specimen other than nasopharyngeal swab, presence of viral mutation(s) within the areas targeted by this assay, and inadequate number of viral copies(<138 copies/mL). A negative result must be combined with clinical observations, patient history, and epidemiological information. The expected result is Negative.  Fact Sheet for Patients:  BloggerCourse.com  Fact Sheet for Healthcare Providers:  SeriousBroker.it  This test is no t yet approved or cleared by the [place] FDA and  has been authorized for detection and/or diagnosis of SARS-CoV-2 by FDA under an Emergency Use Authorization (EUA). This EUA will remain  in effect (meaning this test can be used) for the duration of the COVID-19 declaration under Section 564(b)(1) of the Act, 21 U.S.C.section 360bbb-3(b)(1), unless the authorization is terminated  or revoked sooner.        Influenza A by PCR NEGATIVE NEGATIVE Final   Influenza B by PCR NEGATIVE NEGATIVE Final    Comment: (NOTE) The Xpert Xpress SARS-CoV-2/FLU/RSV plus assay is intended as an aid in the diagnosis of influenza from Nasopharyngeal swab specimens and should not be used as a sole basis for treatment. Nasal washings and aspirates are unacceptable for Xpert Xpress SARS-CoV-2/FLU/RSV testing.  Fact Sheet for Patients: BloggerCourse.com  Fact Sheet for Healthcare Providers: SeriousBroker.it  This test is not yet approved or cleared by the [place] FDA and has been authorized for detection and/or diagnosis of SARS-CoV-2 by FDA under an Emergency Use Authorization (EUA). This EUA will remain in effect (meaning this test can be used) for the duration of the COVID-19 declaration under Section 564(b)(1) of the Act, 21 U.S.C. section 360bbb-3(b)(1), unless the authorization is terminated or revoked.  Performed at [facility], [address] [place], [place] [address]   Culture, blood (Routine X 2) w Reflex to ID Panel     Status: None (Preliminary result)   Collection Time: 12/16/20 10:01 AM   Specimen: BLOOD  Result Value Ref Range Status   Specimen Description   Final    BLOOD LEFT ANTECUBITAL Performed at [facility], [address] [place], [place] [address]    Special Requests   Final    BOTTLES DRAWN AEROBIC AND ANAEROBIC Blood Culture adequate volume Performed at [facility], [address] [place], [place] [address]    Culture   Final    NO GROWTH 4 DAYS Performed at [facility], [address], [place], [place] [address]    Report Status PENDING  Incomplete  Culture, blood (Routine X 2) w Reflex to ID Panel     Status: None (Preliminary result)   Collection Time: 12/16/20 10:01 AM   Specimen: BLOOD  Result Value Ref Range Status   Specimen Description   Final     BLOOD BLOOD LEFT WRIST Performed at [facility], [address], [place], [place] [address]  Special Requests   Final    BOTTLES DRAWN AEROBIC ONLY Blood Culture adequate volume Performed at [facility], [address] [place], [place] [address]    Culture   Final    NO GROWTH 4 DAYS Performed at [facility], [address] [place], [place] [address]    Report Status PENDING  Incomplete  MRSA Next Gen by PCR, Nasal     Status: None   Collection Time: 12/16/20  4:04 PM   Specimen: Nasal Mucosa; Nasal Swab  Result Value Ref Range Status   MRSA by PCR Next Gen NOT DETECTED NOT DETECTED Final    Comment: (NOTE) The GeneXpert MRSA Assay (FDA approved for NASAL specimens only), is one component of a comprehensive MRSA colonization surveillance program. It is not intended to diagnose MRSA infection nor to guide or monitor treatment for MRSA infections. Test performance is not FDA approved in patients less than 42 years old. Performed at [facility], [address] [place], [place] [address]   Blood culture (routine x 2)     Status: None (Preliminary result)   Collection Time: 12/19/20  1:00 AM   Specimen: BLOOD  Result Value Ref Range Status   Specimen Description   Final    BLOOD RIGHT ANTECUBITAL Performed at [facility], [address] [place], [place] [address]    Special Requests   Final    BOTTLES DRAWN AEROBIC AND ANAEROBIC Blood Culture adequate volume Performed at [facility], [address] [place], [place] [address]    Culture   Final    NO GROWTH 1 DAY Performed at [facility], [address] [place], [place] [address]    Report Status PENDING  Incomplete  Blood culture (routine x 2)     Status: None (Preliminary result)   Collection Time: 12/19/20  1:01 AM   Specimen: BLOOD  Result Value Ref Range Status   Specimen Description   Final     BLOOD LEFT ANTECUBITAL Performed at [facility], [address] [place], [place] [address]    Special Requests   Final    BOTTLES DRAWN AEROBIC AND ANAEROBIC Blood Culture adequate volume Performed at [facility], [address] [place], [place] [address]    Culture   Final    NO GROWTH 1 DAY Performed at [facility], [address] [place], [place] [address]    Report Status PENDING  Incomplete  Resp Panel by RT-PCR (Flu A&B, Covid) Nasopharyngeal Swab     Status: None   Collection Time: 12/19/20  2:01 AM   Specimen: Nasopharyngeal Swab; Nasopharyngeal(NP) swabs in vial transport medium  Result Value Ref Range Status   SARS Coronavirus 2 by RT PCR NEGATIVE NEGATIVE Final    Comment: (NOTE) SARS-CoV-2 target nucleic acids are NOT DETECTED.  The SARS-CoV-2 RNA is generally detectable in upper respiratory specimens during the acute phase of infection. The lowest concentration of SARS-CoV-2 viral copies this assay can detect is 138 copies/mL. A negative result does not preclude SARS-Cov-2 infection and should not be used as the sole basis for treatment or other patient management decisions. A negative result may occur with  improper specimen collection/handling, submission of specimen other than nasopharyngeal swab, presence of viral mutation(s) within the areas targeted by this assay, and inadequate number of viral copies(<138 copies/mL). A negative result must be combined with clinical observations, patient history, and epidemiological information.  The expected result is Negative.  Fact Sheet for Patients:  BloggerCourse.com  Fact Sheet for Healthcare Providers:  SeriousBroker.it  This test is no t yet approved or cleared by the [place] FDA and  has been authorized for detection and/or diagnosis of SARS-CoV-2 by FDA under an Emergency Use Authorization (EUA). This EUA will  remain  in effect (meaning this test can be used) for the duration of the COVID-19 declaration under Section 564(b)(1) of the Act, 21 U.S.C.section 360bbb-3(b)(1), unless the authorization is terminated  or revoked sooner.       Influenza A by PCR NEGATIVE NEGATIVE Final   Influenza B by PCR NEGATIVE NEGATIVE Final    Comment: (NOTE) The Xpert Xpress SARS-CoV-2/FLU/RSV plus assay is intended as an aid in the diagnosis of influenza from Nasopharyngeal swab specimens and should not be used as a sole basis for treatment. Nasal washings and aspirates are unacceptable for Xpert Xpress SARS-CoV-2/FLU/RSV testing.  Fact Sheet for Patients: BloggerCourse.com  Fact Sheet for Healthcare Providers: SeriousBroker.it  This test is not yet approved or cleared by the [place] FDA and has been authorized for detection and/or diagnosis of SARS-CoV-2 by FDA under an Emergency Use Authorization (EUA). This EUA will remain in effect (meaning this test can be used) for the duration of the COVID-19 declaration under Section 564(b)(1) of the Act, 21 U.S.C. section 360bbb-3(b)(1), unless the authorization is terminated or revoked.  Performed at [facility], [address], [place], [place] [address]     RN Pressure Injury Documentation:     Estimated body mass index is 30.8 kg/m as calculated from the following:   Height as of 12/16/20:  (1.702 m).   Weight as of 12/16/20: 89.2 kg.  Malnutrition Type:   Malnutrition Characteristics:   Nutrition Interventions:     Radiology Studies: DG CHEST PORT 1 VIEW  Result Date: 12/19/2020 CLINICAL DATA:  Can not take a deep breath EXAM: PORTABLE CHEST 1 VIEW COMPARISON:  None. FINDINGS: Retrocardiac density versus underexposure. No pleural effusion. No pneumothorax. Subsegmental atelectasis the left lower lung. No acute osseous abnormality. IMPRESSION: Retrocardiac density  may be due to patient positioning/technique. Recommend two upright views of the chest for better evaluation. Subsegmental atelectasis in the left lower lung. Electronically Signed   By: Olive Bass M.D.   On: 12/19/2020 09:42    Scheduled Meds:  enoxaparin (LOVENOX) injection  40 mg Subcutaneous Q24H   folic acid  1 mg Oral Daily   LORazepam  0-4 mg Intravenous Q6H   Or   LORazepam  0-4 mg Oral Q6H   [START ON 12/21/2020] LORazepam  0-4 mg Intravenous Q12H   Or   [START ON 12/21/2020] LORazepam  0-4 mg Oral Q12H   multivitamin with minerals  1 tablet Oral Daily   potassium chloride  40 mEq Oral BID   thiamine  100 mg Oral Daily   Continuous Infusions:  lactated ringers 150 mL/hr at 12/20/20 0655   meropenem (MERREM) IV 1 g (12/20/20 0844)    LOS: 1 day   Merlene Laughter, DO [facility] PAGER is on AMION  If 7PM-7AM, please contact night-coverage [email or web address]

## 2020-12-20 NOTE — Progress Notes (Signed)
Subjective: He reports feeling well.  No abdominal pain.  He has pain in the back as a result of the bed.  Objective: Vital signs in last 24 hours: Temp:  [98.5 F (36.9 C)-99.8 F (37.7 C)] 98.7 F (37.1 C) (08/25 0354) Pulse Rate:  [79-102] 79 (08/25 0354) Resp:  [16-29] 20 (08/25 0354) BP: (149-167)/(84-99) 149/96 (08/25 0354) SpO2:  [82 %-100 %] 94 % (08/25 0354) Last BM Date: 12/19/20  Intake/Output from previous day: 08/24 0701 - 08/25 0700 In: 1395.8 [P.O.:240; I.V.:655.8; IV Piggyback:500] Out: -  Intake/Output this shift: No intake/output data recorded.  General appearance: alert and no distress GI: soft, non-tender; bowel sounds normal; no masses,  no organomegaly  Lab Results: Recent Labs    12/18/20 0235 12/19/20 0100 12/20/20 0500  WBC 16.0* 12.9* 10.2  HGB 12.1* 11.2* 10.9*  HCT 35.0* 31.0* 30.9*  PLT 164 198 204   BMET Recent Labs    12/19/20 0100 12/19/20 0528 12/20/20 0500  NA 139 138 138  K 2.8* 2.9* 3.4*  CL 106 106 105  CO2 26 25 27   GLUCOSE 94 94 123*  BUN 6 6 <5*  CREATININE 0.72 0.69 0.60*  CALCIUM 8.2* 8.0* 8.3*   LFT Recent Labs    12/20/20 0500  PROT 5.7*  ALBUMIN 2.4*  AST 34  ALT 40  ALKPHOS 139*  BILITOT 0.8   PT/INR No results for input(s): LABPROT, INR in the last 72 hours. Hepatitis Panel No results for input(s): HEPBSAG, HCVAB, HEPAIGM, HEPBIGM in the last 72 hours. C-Diff No results for input(s): CDIFFTOX in the last 72 hours. Fecal Lactopherrin No results for input(s): FECLLACTOFRN in the last 72 hours.  Studies/Results: DG CHEST PORT 1 VIEW  Result Date: 12/19/2020 CLINICAL DATA:  Can not take a deep breath EXAM: PORTABLE CHEST 1 VIEW COMPARISON:  None. FINDINGS: Retrocardiac density versus underexposure. No pleural effusion. No pneumothorax. Subsegmental atelectasis the left lower lung. No acute osseous abnormality. IMPRESSION: Retrocardiac density may be due to patient positioning/technique. Recommend two  upright views of the chest for better evaluation. Subsegmental atelectasis in the left lower lung. Electronically Signed   By: 12/21/2020 M.D.   On: 12/19/2020 09:42    Medications: Scheduled:  enoxaparin (LOVENOX) injection  40 mg Subcutaneous Q24H   folic acid  1 mg Oral Daily   LORazepam  0-4 mg Intravenous Q6H   Or   LORazepam  0-4 mg Oral Q6H   [START ON 12/21/2020] LORazepam  0-4 mg Intravenous Q12H   Or   [START ON 12/21/2020] LORazepam  0-4 mg Oral Q12H   multivitamin with minerals  1 tablet Oral Daily   thiamine  100 mg Oral Daily   Continuous:  lactated ringers 150 mL/hr at 12/20/20 0655   meropenem (MERREM) IV 1 g (12/20/20 0011)    Assessment/Plan: 1) Acute ETOH pancreatitis. 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 3) ETOH abuse.   The patient is feeling much better from the pancreatitis standpoint.  He does not exhibit any fever and he tolerated PO without any difficulty in the form of full liquids.  Plan: 1) Advance to a regular diet.  If he tolerates the diet he can be discharged tomorrow AM. 2) The splenic vein thrombosis is chronic and he does not live in [place].  He will need to follow up with a provider to further managed and follow the thrombosis.  LOS: 1 day   Noelie Renfrow D 12/20/2020, 6:55 AM

## 2020-12-21 LAB — CBC WITH DIFFERENTIAL/PLATELET
Abs Immature Granulocytes: 0.08 10*3/uL — ABNORMAL HIGH (ref 0.00–0.07)
Basophils Absolute: 0 10*3/uL (ref 0.0–0.1)
Basophils Relative: 0 %
Eosinophils Absolute: 0.2 10*3/uL (ref 0.0–0.5)
Eosinophils Relative: 2 %
HCT: 35.2 % — ABNORMAL LOW (ref 39.0–52.0)
Hemoglobin: 12 g/dL — ABNORMAL LOW (ref 13.0–17.0)
Immature Granulocytes: 1 %
Lymphocytes Relative: 16 %
Lymphs Abs: 1.7 10*3/uL (ref 0.7–4.0)
MCH: 32.9 pg (ref 26.0–34.0)
MCHC: 34.1 g/dL (ref 30.0–36.0)
MCV: 96.4 fL (ref 80.0–100.0)
Monocytes Absolute: 1 10*3/uL (ref 0.1–1.0)
Monocytes Relative: 9 %
Neutro Abs: 7.7 10*3/uL (ref 1.7–7.7)
Neutrophils Relative %: 72 %
Platelets: 264 10*3/uL (ref 150–400)
RBC: 3.65 MIL/uL — ABNORMAL LOW (ref 4.22–5.81)
RDW: 14.6 % (ref 11.5–15.5)
WBC: 10.7 10*3/uL — ABNORMAL HIGH (ref 4.0–10.5)
nRBC: 0 % (ref 0.0–0.2)

## 2020-12-21 LAB — CULTURE, BLOOD (ROUTINE X 2)
Culture: NO GROWTH
Culture: NO GROWTH
Special Requests: ADEQUATE
Special Requests: ADEQUATE

## 2020-12-21 LAB — LIPASE, BLOOD: Lipase: 50 U/L (ref 11–51)

## 2020-12-21 LAB — COMPREHENSIVE METABOLIC PANEL
ALT: 53 U/L — ABNORMAL HIGH (ref 0–44)
AST: 40 U/L (ref 15–41)
Albumin: 2.8 g/dL — ABNORMAL LOW (ref 3.5–5.0)
Alkaline Phosphatase: 151 U/L — ABNORMAL HIGH (ref 38–126)
Anion gap: 8 (ref 5–15)
BUN: 6 mg/dL (ref 6–20)
CO2: 27 mmol/L (ref 22–32)
Calcium: 9 mg/dL (ref 8.9–10.3)
Chloride: 103 mmol/L (ref 98–111)
Creatinine, Ser: 0.72 mg/dL (ref 0.61–1.24)
GFR, Estimated: >60 mL/min (ref >60)
Glucose, Bld: 118 mg/dL — ABNORMAL HIGH (ref 70–99)
Potassium: 3.8 mmol/L (ref 3.5–5.1)
Sodium: 138 mmol/L (ref 135–145)
Total Bilirubin: 0.6 mg/dL (ref 0.3–1.2)
Total Protein: 6.3 g/dL — ABNORMAL LOW (ref 6.5–8.1)

## 2020-12-21 LAB — MAGNESIUM: Magnesium: 1.9 mg/dL (ref 1.7–2.4)

## 2020-12-21 LAB — PHOSPHORUS: Phosphorus: 4.2 mg/dL (ref 2.5–4.6)

## 2020-12-21 MED ORDER — ACETAMINOPHEN 325 MG PO TABS: 650.0000 mg | ORAL_TABLET | Freq: Four times a day (QID) | ORAL | 0 refills | Status: AC | PRN

## 2020-12-21 MED ORDER — ONDANSETRON 4 MG PO TBDP: 4.0000 mg | ORAL_TABLET | Freq: Three times a day (TID) | ORAL | 0 refills | Status: AC | PRN

## 2020-12-21 MED ORDER — ADULT MULTIVITAMIN W/MINERALS CH: 1.0000 | ORAL_TABLET | Freq: Every day | ORAL | 0 refills | Status: AC

## 2020-12-21 MED ORDER — FOLIC ACID 1 MG PO TABS: 1.0000 mg | ORAL_TABLET | Freq: Every day | ORAL | 0 refills | Status: AC

## 2020-12-21 MED ORDER — THIAMINE HCL 100 MG PO TABS: 100.0000 mg | ORAL_TABLET | Freq: Every day | ORAL | 0 refills | Status: AC

## 2020-12-21 NOTE — Progress Notes (Signed)
Subjective: Feeling well.  No abdominal pain with PO intake.  He reports that he is ready to go home.  Objective: Vital signs in last 24 hours: Temp:  [97.1 F (36.2 C)-98.9 F (37.2 C)] 98.9 F (37.2 C) (08/26 0602) Pulse Rate:  [79-87] 79 (08/26 0602) Resp:  [16-20] 16 (08/26 0602) BP: (168-169)/(97-108) 168/108 (08/26 0602) SpO2:  [95 %-98 %] 98 % (08/26 0602) Last BM Date: 12/20/20  Intake/Output from previous day: No intake/output data recorded. Intake/Output this shift: No intake/output data recorded.  General appearance: alert and no distress GI: soft, non-tender; bowel sounds normal; no masses,  no organomegaly  Lab Results: Recent Labs    12/19/20 0100 12/20/20 0500 12/21/20 0531  WBC 12.9* 10.2 10.7*  HGB 11.2* 10.9* 12.0*  HCT 31.0* 30.9* 35.2*  PLT 198 204 264   BMET Recent Labs    12/19/20 0528 12/20/20 0500 12/21/20 0531  NA 138 138 138  K 2.9* 3.4* 3.8  CL 106 105 103  CO2 25 27 27   GLUCOSE 94 123* 118*  BUN 6 <5* 6  CREATININE 0.69 0.60* 0.72  CALCIUM 8.0* 8.3* 9.0   LFT Recent Labs    12/21/20 0531  PROT 6.3*  ALBUMIN 2.8*  AST 40  ALT 53*  ALKPHOS 151*  BILITOT 0.6   PT/INR No results for input(s): LABPROT, INR in the last 72 hours. Hepatitis Panel No results for input(s): HEPBSAG, HCVAB, HEPAIGM, HEPBIGM in the last 72 hours. C-Diff No results for input(s): CDIFFTOX in the last 72 hours. Fecal Lactopherrin No results for input(s): FECLLACTOFRN in the last 72 hours.  Studies/Results: DG CHEST PORT 1 VIEW  Result Date: 12/19/2020 CLINICAL DATA:  Can not take a deep breath EXAM: PORTABLE CHEST 1 VIEW COMPARISON:  None. FINDINGS: Retrocardiac density versus underexposure. No pleural effusion. No pneumothorax. Subsegmental atelectasis the left lower lung. No acute osseous abnormality. IMPRESSION: Retrocardiac density may be due to patient positioning/technique. Recommend two upright views of the chest for better evaluation.  Subsegmental atelectasis in the left lower lung. Electronically Signed   By: 12/21/2020 M.D.   On: 12/19/2020 09:42    Medications: Scheduled:  enoxaparin (LOVENOX) injection  40 mg Subcutaneous Q24H   folic acid  1 mg Oral Daily   LORazepam  0-4 mg Intravenous Q12H   Or   LORazepam  0-4 mg Oral Q12H   multivitamin with minerals  1 tablet Oral Daily   thiamine  100 mg Oral Daily   Continuous:  lactated ringers 150 mL/hr at 12/21/20 0612   meropenem (MERREM) IV 1 g (12/21/20 0048)    Assessment/Plan: 1) Acute ETOH pancreatitis with focal necrosis. 2) ETOH abuse.   Clinically the patient appears well.  He can be discharged home safely at this point.  There was no evidence of any fever and his WBC as well as creatinine/BUN were normal.  His magnesium was noted to be low, which is common for alcoholics.    Plan: 1) Okay to D/C home. 2) Follow up PCP in 12/23/20. 3) No alcohol.  LOS: 2 days   Teniya Filter D 12/21/2020, 7:55 AM

## 2020-12-21 NOTE — Discharge Summary (Addendum)
Physician Discharge Summary  Brian Moran ZOX:[id number] DOB: 10-04-1980 DOA: 12/18/2020  PCP: Oneita Hurt, No  Admit date: 12/18/2020 Discharge date: 12/21/2020  Admitted From: Home Disposition: Home  Recommendations for Outpatient Follow-up:  Follow up with PCP in 1-2 weeks in [place] Follow up with Gastroenterology in 2-4 weeks in [place] Avoid Alcohol  Please obtain CMP/CBC, Mag, Phos in one week Please follow up on the following pending results:  Home Health: No Equipment/Devices: None  Discharge Condition: Stable  CODE STATUS: FULL CODE Diet recommendation: Soft Heart Healthy Diet   Brief/Interim Summary: The patient is a 40 year old obese male with past medical history significant for but not limited to alcohol use disorder, 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  After leaving the hospital he did fine and was able to drink Gatorade and eat food without experience any abdominal pain or vomiting.  He continued some greasy bowel movements.  In the evening he had a low-grade temperature of 99-100 for which she took Tylenol for his wife encouraged him to come back to the hospital to be reevaluated.  He represented for his pancreatitis and blood pressure remained elevated.  Labs showed improving leukocytosis but he did have a potassium of 2.8 and renal function was stable.  He did have a normal lipase and LFTs.  Lactic acid level was normal.  Blood cultures were drawn and he is screened again for COVID and flu which was negative.  He was initiated on a CIWA protocol and also was given IV morphine, Zofran, meropenem, IV potassium 20 mEQ and 1 L normal saline bolus.  Currently he is being admitted and treated for Acute Alcoholic Pancreatitis with Necrosis has significantly improved.  He is deemed  medically stable to be discharged home at this time only to follow-up with PCP and gastroenterology in outpatient setting in [place] as he is not a [employer] resident anymore.  Discharge Diagnoses:  Principal Problem:   Alcoholic pancreatitis Active Problems:   Alcohol use disorder, severe, dependence (HCC)   Splenic vein thrombosis   Pancreatic necrosis   Hypokalemia  Acute alcoholic pancreatitis with necrosis -MRCP done 5 days ago showing moderate to severe acute pancreatitis with focal area of necrosis in the pancreatic tail; no pseudocyst. -Afebrile here but patient reports low-grade fever at home for which he took Acetaminophen for -Labs showing improving leukocytosis as WBC has gone from 22,000 and trended down to 16.0 is now 12.9 -> 10.2 -> 10.9.  Normal lipase and LFTs.  Lactic acid normal. -Not endorsing any abdominal pain, nausea, or vomiting. -Continued meropenem 1 g every 8 hours scheduled for intra-abdominal infection for now but per GI will not need Abx for home -Continue with supportive care and continue with IV morphine 1 mg every 4 hours as needed severe pain while hospitalized and use Ibuprofen and Acetaminophen at D/C  -Continue with antiemetics with IV Zofran 4 mg every 6 as needed for nausea vomiting; will send out with p.o. antiemetics -Repeat blood cultures pending and showed no growth to date today -IV fluid hydration with lactated Ringer's at 150 mL per hour while hospitalized -Patient was advanced to regular diet and he tolerated this well -GI evaluated and appreciate their assistance  Leukocytosis -Mild and Likely Reactive -Continue to Monitor and Trend in the outpatient setting -Follow up with CBC within 1 week   Acute  respiratory failure with hypoxia, improved  -Noted to be desaturating to 82% on room air and complained of shortness of breath yesterday but is improved -Checked chest x-ray and initiate on Xopenex and Atrovent scheduled -CXR revealed  "Retrocardiac density may be due to patient positioning/technique. Recommend two upright views of the chest for better evaluation. Subsegmental atelectasis in the left lower lung." -We will add flutter valve and incentive spirometry -We will do an ambulatory home O2 screen prior to discharge and he did not desaturate   Hypokalemia -EKG, telemetry monitoring while hospitalized -K+ is now 3.8 -Magnesium level now 1.9 -Continue to monitor electrolytes   Hypophosphatemia -Patient's phosphorus level is now 4.2 -Continue monitor and replete as necessary -Repeat phosphorus level in a.m.  Normocytic Anemia -Likely dilutional drop with IV fluid hydration -patient's hemoglobin/hematocrit went from 14.5/41.9 -> 12.1/35.0 -> 11.2/31.0 -> 10.9/30.9 -> 12.0/35.2 -Checked Anemia Panel and showed an iron level of 14, U IBC of 216, TIBC of 230, saturation ratios of 6%, ferritin level 520, folate level 12.6, vitamin B12 261 -Continue to monitor for signs and symptoms bleeding; currently no overt bleeding noted -Follow up with PCP as an outpatient for Supplementation -Repeat CBC within 1 week    Alcohol use disorder -No signs or symptoms of withdrawal -CIWA protocol we will add thiamine, multivitamin with minerals and folic acid daily -C/w    Chronic splenic vein thrombosis, gastric varices -Seen on recent imaging -Continue with supportive care -Further recommendations per gastroenterology and GI recommending outpatient follow-up for further management and follow-up thrombosis as he does not live in [place] and this will need to be done in [place]   Obesity -Complicates overall prognosis and care -Estimated body mass index is 30.8 kg/m as calculated from the following:   Height as of 12/16/20: 5\' 7"  (1.702 m).   Weight as of 12/16/20: 89.2 kg. -Weight Loss and Dietary Counseling given   Discharge Instructions  Discharge Instructions     Call MD for:  difficulty breathing, headache or visual  disturbances   Complete by: As directed    Call MD for:  extreme fatigue   Complete by: As directed    Call MD for:  hives   Complete by: As directed    Call MD for:  persistant dizziness or light-headedness   Complete by: As directed    Call MD for:  persistant nausea and vomiting   Complete by: As directed    Call MD for:  redness, tenderness, or signs of infection (pain, swelling, redness, odor or Blitch/yellow discharge around incision site)   Complete by: As directed    Call MD for:  severe uncontrolled pain   Complete by: As directed    Call MD for:  temperature >100.4   Complete by: As directed    Diet - low sodium heart healthy   Complete by: As directed    Discharge instructions   Complete by: As directed    You were cared for by a hospitalist during your hospital stay. If you have any questions about your discharge medications or the care you received while you were in the hospital after you are discharged, you can call the unit and ask to speak with the hospitalist on call if the hospitalist that took care of you is not available. Once you are discharged, your primary care physician will handle any further medical issues. Please note that NO REFILLS for any discharge medications will be authorized once you are discharged, as it is imperative that  you return to your primary care physician (or establish a relationship with a primary care physician if you do not have one) for your aftercare needs so that they can reassess your need for medications and monitor your lab values.  Follow up with PCP and Gastroenterology within 1-2 weeks. Take all medications as prescribed. If symptoms change or worsen please return to the ED for evaluation   Increase activity slowly   Complete by: As directed       Allergies as of 12/21/2020   No Known Allergies      Medication List     TAKE these medications    acetaminophen 325 MG tablet Commonly known as: TYLENOL Take 2 tablets (650 mg  total) by mouth every 6 (six) hours as needed for moderate pain.   folic acid 1 MG tablet Commonly known as: FOLVITE Take 1 tablet (1 mg total) by mouth daily.   ibuprofen 200 MG tablet Commonly known as: ADVIL Take 400 mg by mouth every 6 (six) hours as needed for moderate pain.   multivitamin with minerals Tabs tablet Take 1 tablet by mouth daily.   ondansetron 4 MG disintegrating tablet Commonly known as: Zofran ODT Take 1 tablet (4 mg total) by mouth every 8 (eight) hours as needed for nausea or vomiting.   thiamine 100 MG tablet Take 1 tablet (100 mg total) by mouth daily.       No Known Allergies  Consultations: Gastroenterology Dr. Elnoria Howard  Procedures/Studies: CT ABDOMEN PELVIS W CONTRAST  Result Date: 12/15/2020 CLINICAL DATA:  Abdominal pain, acute, nonlocalized. History of pancreatitis. EXAM: CT ABDOMEN AND PELVIS WITH CONTRAST TECHNIQUE: Multidetector CT imaging of the abdomen and pelvis was performed using the standard protocol following bolus administration of intravenous contrast. CONTRAST:  OMNIPAQUE IOHEXOL 300 MG/ML  SOLN COMPARISON:  None. FINDINGS: Lower chest: Mild bibasilar atelectasis. The visualized heart and pericardium are unremarkable. Hepatobiliary: No focal liver abnormality is seen. Status post cholecystectomy. No biliary dilatation. Pancreas: There is mild peripancreatic inflammatory change extending into the porta hepatis and into the anterior pararenal spaces bilaterally as well as the mesentery in keeping with changes of acute edematous/interstitial pancreatitis. Normal enhancement of the pancreatic parenchyma. Pancreatic duct is not dilated. Punctate nonspecific calcifications are seen within the pancreatic head, possibly the sequela of remote pancreatitis. No in capsulated peripancreatic fluid collections or necrosis. No peripancreatic adenopathy. Spleen: Unremarkable Adrenals/Urinary Tract: Adrenal glands are unremarkable. Kidneys are normal,  without renal calculi, focal lesion, or hydronephrosis. Bladder is unremarkable. Stomach/Bowel: Periduodenal inflammatory change likely relates to adjacent pancreatitis. Stomach is within normal limits. Appendix appears normal. No other evidence of bowel wall thickening, distention, or inflammatory changes. No free intraperitoneal gas or fluid. Vascular/Lymphatic: There is chronic thrombosis of the splenic vein with numerous varices identified within the gastric fundus. Superior mesenteric vein and portal vein are patent. The abdominal vasculature is otherwise unremarkable. Reproductive: Prostate is unremarkable. Other: No abdominal wall hernia identified.  Rectum unremarkable. Musculoskeletal: No acute or significant osseous findings. IMPRESSION: Mild acute interstitial/edematous pancreatitis. No evidence of pancreatic or peripancreatic necrosis. 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 Electronically Signed   By: Helyn Numbers M.D.   On: 12/15/2020 02:55   MR 3D Recon At Scanner  Result Date: 12/15/2020 CLINICAL DATA:  Acute pancreatitis. Severe abdominal pain. Elevated liver function tests. Alcohol dependence. Prior cholecystectomy. EXAM: MRI ABDOMEN WITHOUT AND WITH CONTRAST (INCLUDING MRCP) TECHNIQUE: Multiplanar multisequence MR imaging of the  abdomen was performed both before and after the administration of intravenous contrast. Heavily T2-weighted images of the biliary and pancreatic ducts were obtained, and three-dimensional MRCP images were rendered by post processing. CONTRAST:  20mL GADAVIST GADOBUTROL 1 MMOL/ML IV SOLN COMPARISON:  CT on 12/15/2020 FINDINGS: Lower chest: No acute findings. Hepatobiliary: No hepatic masses identified. Mild ascites noted. Prior cholecystectomy. No evidence of biliary ductal dilatation or choledocholithiasis. Pancreas: Diffuse pancreatic swelling, with mild-to-moderate peripancreatic  inflammatory changes and fluid, consistent with acute pancreatitis. A focal area of absent contrast enhancement is seen in the pancreatic tail measuring approximately 3.7 x 2.0 cm, consistent with area of pancreatic necrosis. No evidence of pseudocysts. No evidence of pancreatic mass or pancreatic ductal dilatation. Spleen:  Within normal limits in size and appearance. Adrenals/Urinary Tract: No masses identified. No evidence of hydronephrosis. Stomach/Bowel: Visualized portion unremarkable. Vascular/Lymphatic: No pathologically enlarged lymph nodes identified. Splenic vein thrombosis is again seen with venous collaterals in left upper quadrant. Other:  None. Musculoskeletal:  No suspicious bone lesions identified. IMPRESSION: Moderate to severe acute pancreatitis, with focal area of necrosis in the pancreatic tail. No evidence of pancreatic pseudocyst. Prior cholecystectomy. No evidence of biliary ductal dilatation or choledocholithiasis. Splenic vein thrombosis with left upper quadrant venous collaterals. Electronically Signed   By: Danae Orleans M.D.   On: 12/15/2020 18:18   DG CHEST PORT 1 VIEW  Result Date: 12/19/2020 CLINICAL DATA:  Can not take a deep breath EXAM: PORTABLE CHEST 1 VIEW COMPARISON:  None. FINDINGS: Retrocardiac density versus underexposure. No pleural effusion. No pneumothorax. Subsegmental atelectasis the left lower lung. No acute osseous abnormality. IMPRESSION: Retrocardiac density may be due to patient positioning/technique. Recommend two upright views of the chest for better evaluation. Subsegmental atelectasis in the left lower lung. Electronically Signed   By: Olive Bass M.D.   On: 12/19/2020 09:42   MR ABDOMEN MRCP W WO CONTAST  Result Date: 12/15/2020 CLINICAL DATA:  Acute pancreatitis. Severe abdominal pain. Elevated liver function tests. Alcohol dependence. Prior cholecystectomy. EXAM: MRI ABDOMEN WITHOUT AND WITH CONTRAST (INCLUDING MRCP) TECHNIQUE: Multiplanar  multisequence MR imaging of the abdomen was performed both before and after the administration of intravenous contrast. Heavily T2-weighted images of the biliary and pancreatic ducts were obtained, and three-dimensional MRCP images were rendered by post processing. CONTRAST:  64mL GADAVIST GADOBUTROL 1 MMOL/ML IV SOLN COMPARISON:  CT on 12/15/2020 FINDINGS: Lower chest: No acute findings. Hepatobiliary: No hepatic masses identified. Mild ascites noted. Prior cholecystectomy. No evidence of biliary ductal dilatation or choledocholithiasis. Pancreas: Diffuse pancreatic swelling, with mild-to-moderate peripancreatic inflammatory changes and fluid, consistent with acute pancreatitis. A focal area of absent contrast enhancement is seen in the pancreatic tail measuring approximately 3.7 x 2.0 cm, consistent with area of pancreatic necrosis. No evidence of pseudocysts. No evidence of pancreatic mass or pancreatic ductal dilatation. Spleen:  Within normal limits in size and appearance. Adrenals/Urinary Tract: No masses identified. No evidence of hydronephrosis. Stomach/Bowel: Visualized portion unremarkable. Vascular/Lymphatic: No pathologically enlarged lymph nodes identified. Splenic vein thrombosis is again seen with venous collaterals in left upper quadrant. Other:  None. Musculoskeletal:  No suspicious bone lesions identified. IMPRESSION: Moderate to severe acute pancreatitis, with focal area of necrosis in the pancreatic tail. No evidence of pancreatic pseudocyst. Prior cholecystectomy. No evidence of biliary ductal dilatation or choledocholithiasis. Splenic vein thrombosis with left upper quadrant venous collaterals. Electronically Signed   By: Danae Orleans M.D.   On: 12/15/2020 18:18     Subjective: Seen and examined at bedside  and he is feeling much better back to his baseline.  Denies any chest pain or shortness breath.  Feels better and no abdominal pain.  No other concerns or complaints.  Is medically stable  to be discharged home and follow-up with his PCP and Gastroenterologist outpatient setting.  Discharge Exam: Vitals:   12/20/20 2045 12/21/20 0602  BP: (!) 169/97 (!) 168/108  Pulse: 80 79  Resp: 20 16  Temp: 98.4 F (36.9 C) 98.9 F (37.2 C)  SpO2: 98% 98%   Vitals:   12/20/20 0354 12/20/20 1245 12/20/20 2045 12/21/20 0602  BP: (!) 149/96 (!) 169/97 (!) 169/97 (!) 168/108  Pulse: 79 87 80 79  Resp: Temp: 98.7 F (37.1 C) (!) 97.1 F (36.2 C) 98.4 F (36.9 C) 98.9 F (37.2 C)  TempSrc: Oral Oral Oral Oral  SpO2: 94% 95% 98% 98%   General: Pt is alert, awake, not in acute distress Cardiovascular: RRR, S1/S2 +, no rubs, no gallops Respiratory: Diminished bilaterally, no wheezing, no rhonchi Abdominal: Soft, NT, Distended 2/2 to body habitus, bowel sounds + Extremities: no edema, no cyanosis  The results of significant diagnostics from this hospitalization (including imaging, microbiology, ancillary and laboratory) are listed below for reference.    Microbiology: Recent Results (from the past 240 hour(s))  Resp Panel by RT-PCR (Flu A&B, Covid) Nasopharyngeal Swab     Status: None   Collection Time: 12/15/20  2:10 AM   Specimen: Nasopharyngeal Swab; Nasopharyngeal(NP) swabs in vial transport medium  Result Value Ref Range Status   SARS Coronavirus 2 by RT PCR NEGATIVE NEGATIVE Final    Comment: (NOTE) SARS-CoV-2 target nucleic acids are NOT DETECTED.  The SARS-CoV-2 RNA is generally detectable in upper respiratory specimens during the acute phase of infection. The lowest concentration of SARS-CoV-2 viral copies this assay can detect is 138 copies/mL. A negative result does not preclude SARS-Cov-2 infection and should not be used as the sole basis for treatment or other patient management decisions. A negative result may occur with  improper specimen collection/handling, submission of specimen other than nasopharyngeal swab, presence of viral mutation(s)  within the areas targeted by this assay, and inadequate number of viral copies(<138 copies/mL). A negative result must be combined with clinical observations, patient history, and epidemiological information. The expected result is Negative.  Fact Sheet for Patients:  BloggerCourse.com  Fact Sheet for Healthcare Providers:  SeriousBroker.it  This test is no t yet approved or cleared by the [place] FDA and  has been authorized for detection and/or diagnosis of SARS-CoV-2 by FDA under an Emergency Use Authorization (EUA). This EUA will remain  in effect (meaning this test can be used) for the duration of the COVID-19 declaration under Section 564(b)(1) of the Act, 21 U.S.C.section 360bbb-3(b)(1), unless the authorization is terminated  or revoked sooner.       Influenza A by PCR NEGATIVE NEGATIVE Final   Influenza B by PCR NEGATIVE NEGATIVE Final    Comment: (NOTE) The Xpert Xpress SARS-CoV-2/FLU/RSV plus assay is intended as an aid in the diagnosis of influenza from Nasopharyngeal swab specimens and should not be used as a sole basis for treatment. Nasal washings and aspirates are unacceptable for Xpert Xpress SARS-CoV-2/FLU/RSV testing.  Fact Sheet for Patients: BloggerCourse.com  Fact Sheet for Healthcare Providers: SeriousBroker.it  This test is not yet approved or cleared by the [place] FDA and has been authorized for detection and/or diagnosis of SARS-CoV-2 by FDA under an Emergency Use Authorization (  EUA). This EUA will remain in effect (meaning this test can be used) for the duration of the COVID-19 declaration under Section 564(b)(1) of the Act, 21 U.S.C. section 360bbb-3(b)(1), unless the authorization is terminated or revoked.  Performed at [facility], [address] [place], [place] [address]   Culture, blood (Routine X 2) w Reflex to ID  Panel     Status: None   Collection Time: 12/16/20 10:01 AM   Specimen: BLOOD  Result Value Ref Range Status   Specimen Description   Final    BLOOD LEFT ANTECUBITAL Performed at [facility], [address] [place], [place] [address]    Special Requests   Final    BOTTLES DRAWN AEROBIC AND ANAEROBIC Blood Culture adequate volume Performed at [facility], [address] [place], [place] [address]    Culture   Final    NO GROWTH 5 DAYS Performed at [facility], [address] [place], [place] [address]    Report Status 12/21/2020 FINAL  Final  Culture, blood (Routine X 2) w Reflex to ID Panel     Status: None   Collection Time: 12/16/20 10:01 AM   Specimen: BLOOD  Result Value Ref Range Status   Specimen Description   Final    BLOOD BLOOD LEFT WRIST Performed at [facility], [address] [place], [place] [address]    Special Requests   Final    BOTTLES DRAWN AEROBIC ONLY Blood Culture adequate volume Performed at [facility], [address] [place], [place] [address]    Culture   Final    NO GROWTH 5 DAYS Performed at [facility], [address] [place], [place] [address]    Report Status 12/21/2020 FINAL  Final  MRSA Next Gen by PCR, Nasal     Status: None   Collection Time: 12/16/20  4:04 PM   Specimen: Nasal Mucosa; Nasal Swab  Result Value Ref Range Status   MRSA by PCR Next Gen NOT DETECTED NOT DETECTED Final    Comment: (NOTE) The GeneXpert MRSA Assay (FDA approved for NASAL specimens only), is one component of a comprehensive MRSA colonization surveillance program. It is not intended to diagnose MRSA infection nor to guide or monitor treatment for MRSA infections. Test performance is not FDA approved in patients less than 42 years old. Performed at [facility], [address] [place], [place] [address]   Blood culture (routine x 2)     Status:  None (Preliminary result)   Collection Time: 12/19/20  1:00 AM   Specimen: BLOOD  Result Value Ref Range Status   Specimen Description   Final    BLOOD RIGHT ANTECUBITAL Performed at [facility], [address] [place], [place] [address]    Special Requests   Final    BOTTLES DRAWN AEROBIC AND ANAEROBIC Blood Culture adequate volume Performed at [facility], [address] [place], [place] [address]    Culture   Final    NO GROWTH 2 DAYS Performed at [facility], [address] [place], [place] [address]    Report Status PENDING  Incomplete  Blood culture (routine x 2)     Status: None (Preliminary result)   Collection Time: 12/19/20  1:01 AM   Specimen: BLOOD  Result Value Ref Range Status   Specimen Description   Final    BLOOD LEFT ANTECUBITAL Performed  at [facility], [address], [place], [place] [address]    Special Requests   Final    BOTTLES DRAWN AEROBIC AND ANAEROBIC Blood Culture adequate volume Performed at [facility], [address] [place], [place] [address]    Culture   Final    NO GROWTH 2 DAYS Performed at [facility], [address], [place], [place] [address]    Report Status PENDING  Incomplete  Resp Panel by RT-PCR (Flu A&B, Covid) Nasopharyngeal Swab     Status: None   Collection Time: 12/19/20  2:01 AM   Specimen: Nasopharyngeal Swab; Nasopharyngeal(NP) swabs in vial transport medium  Result Value Ref Range Status   SARS Coronavirus 2 by RT PCR NEGATIVE NEGATIVE Final    Comment: (NOTE) SARS-CoV-2 target nucleic acids are NOT DETECTED.  The SARS-CoV-2 RNA is generally detectable in upper respiratory specimens during the acute phase of infection. The lowest concentration of SARS-CoV-2 viral copies this assay can detect is 138 copies/mL. A negative result does not preclude SARS-Cov-2 infection and should not be used as the sole basis for treatment  or other patient management decisions. A negative result may occur with  improper specimen collection/handling, submission of specimen other than nasopharyngeal swab, presence of viral mutation(s) within the areas targeted by this assay, and inadequate number of viral copies(<138 copies/mL). A negative result must be combined with clinical observations, patient history, and epidemiological information. The expected result is Negative.  Fact Sheet for Patients:  BloggerCourse.com  Fact Sheet for Healthcare Providers:  SeriousBroker.it  This test is no t yet approved or cleared by the [place] FDA and  has been authorized for detection and/or diagnosis of SARS-CoV-2 by FDA under an Emergency Use Authorization (EUA). This EUA will remain  in effect (meaning this test can be used) for the duration of the COVID-19 declaration under Section 564(b)(1) of the Act, 21 U.S.C.section 360bbb-3(b)(1), unless the authorization is terminated  or revoked sooner.       Influenza A by PCR NEGATIVE NEGATIVE Final   Influenza B by PCR NEGATIVE NEGATIVE Final    Comment: (NOTE) The Xpert Xpress SARS-CoV-2/FLU/RSV plus assay is intended as an aid in the diagnosis of influenza from Nasopharyngeal swab specimens and should not be used as a sole basis for treatment. Nasal washings and aspirates are unacceptable for Xpert Xpress SARS-CoV-2/FLU/RSV testing.  Fact Sheet for Patients: BloggerCourse.com  Fact Sheet for Healthcare Providers: SeriousBroker.it  This test is not yet approved or cleared by the [place] FDA and has been authorized for detection and/or diagnosis of SARS-CoV-2 by FDA under an Emergency Use Authorization (EUA). This EUA will remain in effect (meaning this test can be used) for the duration of the COVID-19 declaration under Section 564(b)(1) of the Act, 21 U.S.C. section  360bbb-3(b)(1), unless the authorization is terminated or revoked.  Performed at [facility], [address], [place], [place] [address]     Labs: BNP (last 3 results) No results for input(s): BNP in the last 8760 hours. Basic Metabolic Panel: Recent Labs  Lab 12/17/20 0247 12/18/20 0235 12/19/20 0100 12/19/20 0528 12/20/20 0500 12/21/20 0531  NA 137 133* 139 138 138 138  K 3.9 3.3* 2.8* 2.9* 3.4* 3.8  CL 106 103 106 106 105 103  CO2 GLUCOSE 88 116* 94 94 123* 118*  BUN <5* 6  CREATININE 0.79 0.65 0.72 0.69 0.60* 0.72  CALCIUM 7.8* 7.6* 8.2* 8.0* 8.3* 9.0  MG 2.0 2.2 2.3  --  2.1 1.9  PHOS 2.1* 1.5*  --  1.7* 3.2 4.2   Liver Function Tests: Recent Labs  Lab 12/18/20 0235 12/19/20 0100 12/19/20 0528 12/20/20 0500 12/21/20 0531  AST 21 39 35 34 40  ALT 35 40 39 40 53*  ALKPHOS 80 115 114 139* 151*  BILITOT 1.3* 0.7 0.9 0.8 0.6  PROT 5.6* 6.1* 5.9* 5.7* 6.3*  ALBUMIN 2.7* 2.7* 2.6* 2.4* 2.8*   Recent Labs  Lab 12/15/20 0056 12/17/20 0247 12/18/20 0235 12/19/20 0100 12/21/20 0531  LIPASE 686* 56* 34 40 50   No results for input(s): AMMONIA in the last 168 hours. CBC: Recent Labs  Lab 12/17/20 0247 12/18/20 0235 12/19/20 0100 12/20/20 0500 12/21/20 0531  WBC 22.0* 16.0* 12.9* 10.2 10.7*  NEUTROABS  --   --  10.2* 7.4 7.7  HGB 14.5 12.1* 11.2* 10.9* 12.0*  HCT 41.9 35.0* 31.0* 30.9* 35.2*  MCV 97.7 96.2 94.2 95.4 96.4  PLT 187 164 198 204 264   Cardiac Enzymes: No results for input(s): CKTOTAL, CKMB, CKMBINDEX, TROPONINI in the last 168 hours. BNP: Invalid input(s): POCBNP CBG: No results for input(s): GLUCAP in the last 168 hours. D-Dimer No results for input(s): DDIMER in the last 72 hours. Hgb A1c No results for input(s): HGBA1C in the last 72 hours. Lipid Profile No results for input(s): CHOL, HDL, LDLCALC, TRIG, CHOLHDL, LDLDIRECT in the last 72 hours. Thyroid function studies No results  for input(s): TSH, T4TOTAL, T3FREE, THYROIDAB in the last 72 hours.  Invalid input(s): FREET3 Anemia work up Recent Labs    12/20/20 0500  VITAMINB12 261  FOLATE 12.6  FERRITIN 520*  TIBC 230*  IRON 14*  RETICCTPCT 1.5   Urinalysis No results found for: COLORURINE, APPEARANCEUR, LABSPEC, PHURINE, GLUCOSEU, HGBUR, BILIRUBINUR, KETONESUR, PROTEINUR, UROBILINOGEN, NITRITE, LEUKOCYTESUR Sepsis Labs Invalid input(s): PROCALCITONIN,  WBC,  LACTICIDVEN Microbiology Recent Results (from the past 240 hour(s))  Resp Panel by RT-PCR (Flu A&B, Covid) Nasopharyngeal Swab     Status: None   Collection Time: 12/15/20  2:10 AM   Specimen: Nasopharyngeal Swab; Nasopharyngeal(NP) swabs in vial transport medium  Result Value Ref Range Status   SARS Coronavirus 2 by RT PCR NEGATIVE NEGATIVE Final    Comment: (NOTE) SARS-CoV-2 target nucleic acids are NOT DETECTED.  The SARS-CoV-2 RNA is generally detectable in upper respiratory specimens during the acute phase of infection. The lowest concentration of SARS-CoV-2 viral copies this assay can detect is 138 copies/mL. A negative result does not preclude SARS-Cov-2 infection and should not be used as the sole basis for treatment or other patient management decisions. A negative result may occur with  improper specimen collection/handling, submission of specimen other than nasopharyngeal swab, presence of viral mutation(s) within the areas targeted by this assay, and inadequate number of viral copies(<138 copies/mL). A negative result must be combined with clinical observations, patient history, and epidemiological information. The expected result is Negative.  Fact Sheet for Patients:  BloggerCourse.com  Fact Sheet for Healthcare Providers:  SeriousBroker.it  This test is no t yet approved or cleared by the [place] FDA and  has been authorized for detection and/or diagnosis of SARS-CoV-2  by FDA under an Emergency Use Authorization (EUA). This EUA will remain  in effect (meaning this test can be used) for the duration of the COVID-19 declaration under Section 564(b)(1) of the Act, 21 U.S.C.section 360bbb-3(b)(1), unless the authorization is terminated  or  revoked sooner.       Influenza A by PCR NEGATIVE NEGATIVE Final   Influenza B by PCR NEGATIVE NEGATIVE Final    Comment: (NOTE) The Xpert Xpress SARS-CoV-2/FLU/RSV plus assay is intended as an aid in the diagnosis of influenza from Nasopharyngeal swab specimens and should not be used as a sole basis for treatment. Nasal washings and aspirates are unacceptable for Xpert Xpress SARS-CoV-2/FLU/RSV testing.  Fact Sheet for Patients: BloggerCourse.com  Fact Sheet for Healthcare Providers: SeriousBroker.it  This test is not yet approved or cleared by the [place] FDA and has been authorized for detection and/or diagnosis of SARS-CoV-2 by FDA under an Emergency Use Authorization (EUA). This EUA will remain in effect (meaning this test can be used) for the duration of the COVID-19 declaration under Section 564(b)(1) of the Act, 21 U.S.C. section 360bbb-3(b)(1), unless the authorization is terminated or revoked.  Performed at [facility], [address] [place], [place] [address]   Culture, blood (Routine X 2) w Reflex to ID Panel     Status: None   Collection Time: 12/16/20 10:01 AM   Specimen: BLOOD  Result Value Ref Range Status   Specimen Description   Final    BLOOD LEFT ANTECUBITAL Performed at [facility], [address] [place], [place] [address]    Special Requests   Final    BOTTLES DRAWN AEROBIC AND ANAEROBIC Blood Culture adequate volume Performed at [facility], [address] [place], [place] [address]    Culture   Final    NO GROWTH 5 DAYS Performed at [facility], [address] [place], [place] [address]    Report Status 12/21/2020 FINAL  Final  Culture, blood (Routine X 2) w Reflex to ID Panel     Status: None   Collection Time: 12/16/20 10:01 AM   Specimen: BLOOD  Result Value Ref Range Status   Specimen Description   Final    BLOOD BLOOD LEFT WRIST Performed at [facility], [address] [place], [place] [address]    Special Requests   Final    BOTTLES DRAWN AEROBIC ONLY Blood Culture adequate volume Performed at [facility], [address], [place], [place] [address]    Culture   Final    NO GROWTH 5 DAYS Performed at [facility], [address], [place], [place] [address]    Report Status 12/21/2020 FINAL  Final  MRSA Next Gen by PCR, Nasal     Status: None   Collection Time: 12/16/20  4:04 PM   Specimen: Nasal Mucosa; Nasal Swab  Result Value Ref Range Status   MRSA by PCR Next Gen NOT DETECTED NOT DETECTED Final    Comment: (NOTE) The GeneXpert MRSA Assay (FDA approved for NASAL specimens only), is one component of a comprehensive MRSA colonization surveillance program. It is not intended to diagnose MRSA infection nor to guide or monitor treatment for MRSA infections. Test performance is not FDA approved in patients less than 77 years old. Performed at [facility], [address] [place], [place] [address]   Blood culture (routine x 2)     Status: None (Preliminary result)   Collection Time: 12/19/20  1:00 AM   Specimen: BLOOD  Result Value Ref Range Status   Specimen Description   Final    BLOOD RIGHT ANTECUBITAL Performed at [facility], [address], [place], [place] [address]  Special Requests   Final    BOTTLES DRAWN AEROBIC AND ANAEROBIC Blood Culture adequate volume Performed at [facility], [address], [place], [place] [address]    Culture   Final    NO GROWTH 2 DAYS Performed at [facility],  [address] [place], [place] [address]    Report Status PENDING  Incomplete  Blood culture (routine x 2)     Status: None (Preliminary result)   Collection Time: 12/19/20  1:01 AM   Specimen: BLOOD  Result Value Ref Range Status   Specimen Description   Final    BLOOD LEFT ANTECUBITAL Performed at [facility], [address], [place], [place] [address]    Special Requests   Final    BOTTLES DRAWN AEROBIC AND ANAEROBIC Blood Culture adequate volume Performed at [facility], [address] [place], [place] [address]    Culture   Final    NO GROWTH 2 DAYS Performed at [facility], [address], [place], [place] [address]    Report Status PENDING  Incomplete  Resp Panel by RT-PCR (Flu A&B, Covid) Nasopharyngeal Swab     Status: None   Collection Time: 12/19/20  2:01 AM   Specimen: Nasopharyngeal Swab; Nasopharyngeal(NP) swabs in vial transport medium  Result Value Ref Range Status   SARS Coronavirus 2 by RT PCR NEGATIVE NEGATIVE Final    Comment: (NOTE) SARS-CoV-2 target nucleic acids are NOT DETECTED.  The SARS-CoV-2 RNA is generally detectable in upper respiratory specimens during the acute phase of infection. The lowest concentration of SARS-CoV-2 viral copies this assay can detect is 138 copies/mL. A negative result does not preclude SARS-Cov-2 infection and should not be used as the sole basis for treatment or other patient management decisions. A negative result may occur with  improper specimen collection/handling, submission of specimen other than nasopharyngeal swab, presence of viral mutation(s) within the areas targeted by this assay, and inadequate number of viral copies(<138 copies/mL). A negative result must be combined with clinical observations, patient history, and epidemiological information. The expected result is Negative.  Fact Sheet for Patients:  BloggerCourse.com  Fact Sheet for  Healthcare Providers:  SeriousBroker.it  This test is no t yet approved or cleared by the [place] FDA and  has been authorized for detection and/or diagnosis of SARS-CoV-2 by FDA under an Emergency Use Authorization (EUA). This EUA will remain  in effect (meaning this test can be used) for the duration of the COVID-19 declaration under Section 564(b)(1) of the Act, 21 U.S.C.section 360bbb-3(b)(1), unless the authorization is terminated  or revoked sooner.       Influenza A by PCR NEGATIVE NEGATIVE Final   Influenza B by PCR NEGATIVE NEGATIVE Final    Comment: (NOTE) The Xpert Xpress SARS-CoV-2/FLU/RSV plus assay is intended as an aid in the diagnosis of influenza from Nasopharyngeal swab specimens and should not be used as a sole basis for treatment. Nasal washings and aspirates are unacceptable for Xpert Xpress SARS-CoV-2/FLU/RSV testing.  Fact Sheet for Patients: BloggerCourse.com  Fact Sheet for Healthcare Providers: SeriousBroker.it  This test is not yet approved or cleared by the [place] FDA and has been authorized for detection and/or diagnosis of SARS-CoV-2 by FDA under an Emergency Use Authorization (EUA). This EUA will remain in effect (meaning this test can be used) for the duration of the COVID-19 declaration under Section 564(b)(1) of the Act, 21 U.S.C. section 360bbb-3(b)(1), unless the authorization is  terminated or revoked.  Performed at [facility], [address], [place], [place] [address]    Time coordinating discharge: 35 minutes  SIGNED:  Merlene Laughter, DO [facility] 12/21/2020, 9:12 AM Pager is on AMION  If 7PM-7AM, please contact night-coverage [email or web address]

## 2020-12-21 NOTE — Progress Notes (Addendum)
Pt discharged home. AVS printed. Educational teaching completed with "Ask Me 3". PIV removed. Walk test completed. Pt provided with work note from MD. Pt provided printed prescription. Pt has all belongings. Pt escorted to lobby with staff. No further questions at this time.

## 2020-12-21 NOTE — Progress Notes (Signed)
SATURATION QUALIFICATIONS: (This note is used to comply with regulatory documentation for home oxygen)  Patient Saturations on Room Air at Rest = 100%  Patient Saturations on Room Air while Ambulating = 99%  

## 2020-12-24 LAB — CULTURE, BLOOD (ROUTINE X 2)
Culture: NO GROWTH
Culture: NO GROWTH
Special Requests: ADEQUATE
Special Requests: ADEQUATE

## 2023-05-23 IMAGING — DX DG CHEST 1V PORT
1 series · 1 of 1 positions shown · non-contrast
Comparison: None.

CLINICAL DATA: Can not take a deep breath

EXAM:
PORTABLE CHEST 1 VIEW

[chest ap]
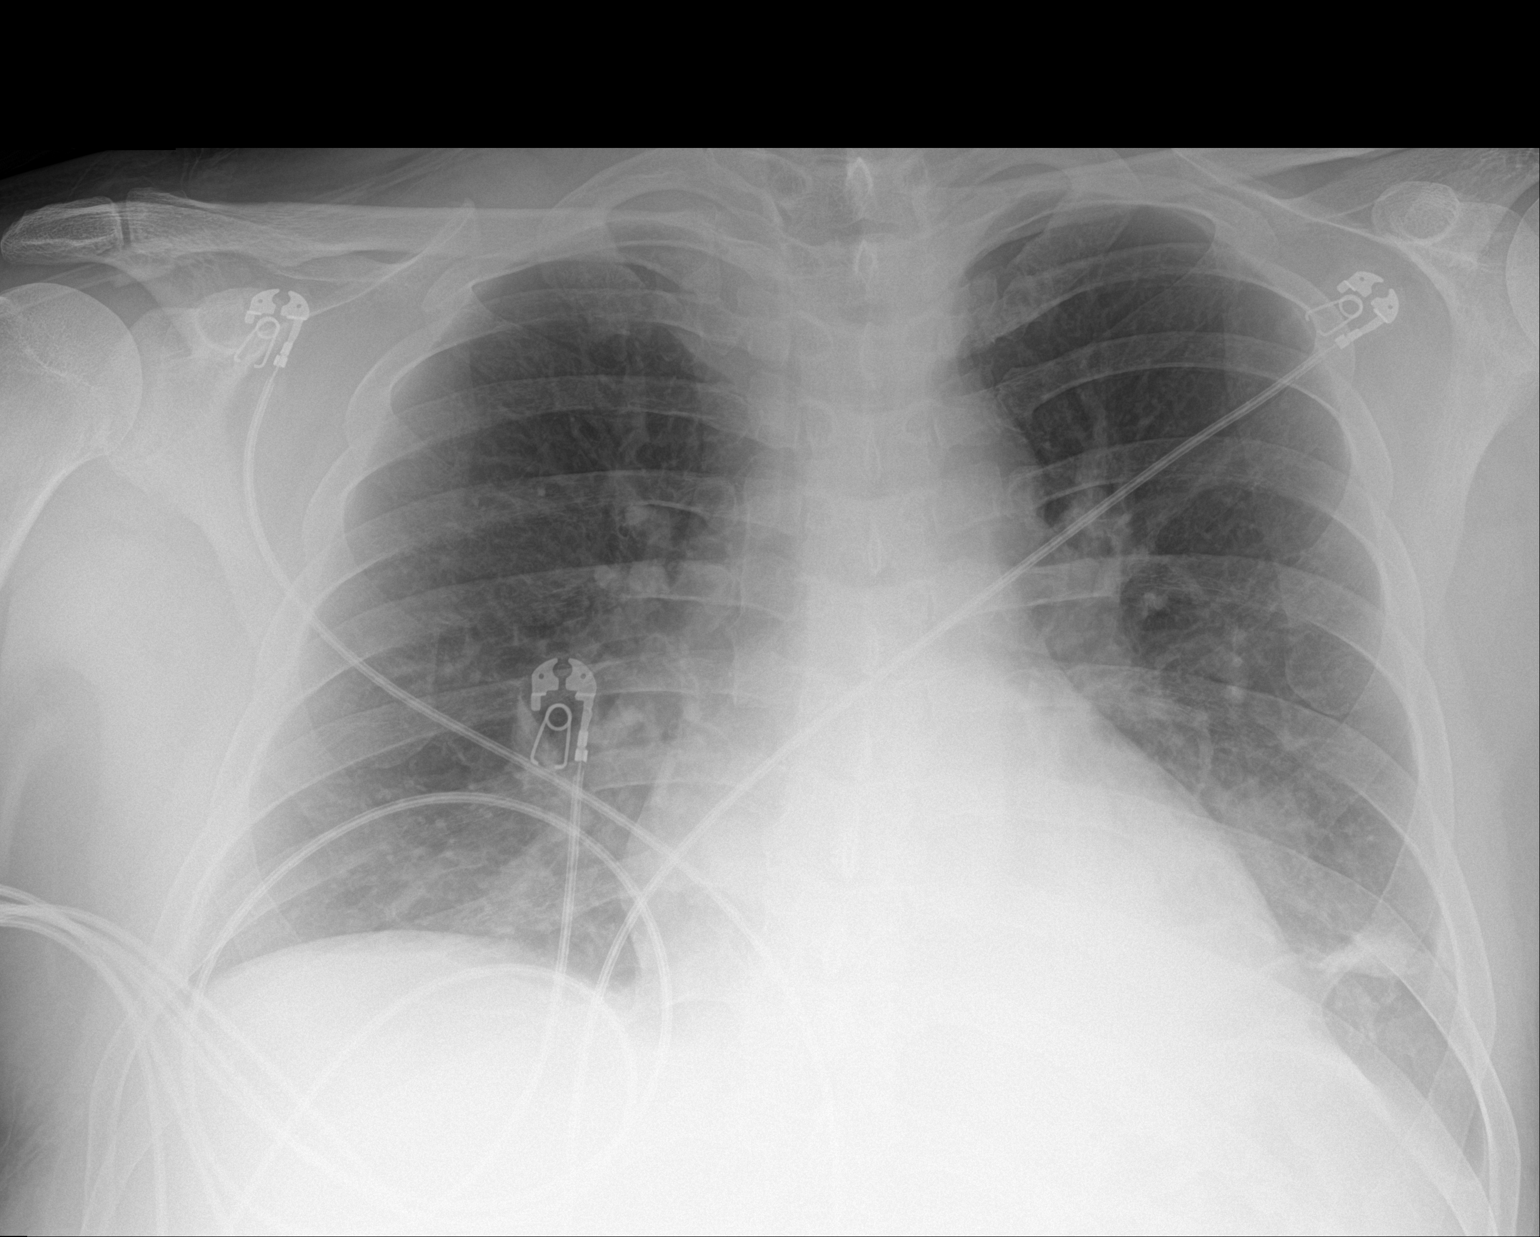

[1 of 1 positions shown; findings below may reference images not displayed]

FINDINGS: Retrocardiac density versus underexposure. No pleural effusion. No
pneumothorax. Subsegmental atelectasis the left lower lung. No acute
osseous abnormality.
IMPRESSION: Retrocardiac density may be due to patient positioning/technique.
Recommend two upright views of the chest for better evaluation.
Subsegmental atelectasis in the left lower lung.
